# Patient Record
Sex: Female | Born: 1935
Health system: Southern US, Community
[De-identification: ages and names within clinical notes are randomized; demographics above are authoritative.]

---

## 2018-10-01 ENCOUNTER — Emergency Department (HOSPITAL_BASED_OUTPATIENT_CLINIC_OR_DEPARTMENT_OTHER)
Admission: EM | Admit: 2018-10-01 | Discharge: 2018-10-01 | Disposition: A | Discharge status: Home / Self Care | Payer: Medicare Other | Attending: Emergency Medicine | Admitting: Emergency Medicine

## 2018-10-01 ENCOUNTER — Emergency Department (HOSPITAL_BASED_OUTPATIENT_CLINIC_OR_DEPARTMENT_OTHER): Payer: Medicare Other

## 2018-10-01 ENCOUNTER — Other Ambulatory Visit: Payer: Self-pay

## 2018-10-01 ENCOUNTER — Encounter (HOSPITAL_BASED_OUTPATIENT_CLINIC_OR_DEPARTMENT_OTHER): Payer: Self-pay | Admitting: *Deleted

## 2018-10-01 DIAGNOSIS — S42001A Fracture of unspecified part of right clavicle, initial encounter for closed fracture: Secondary | ICD-10-CM | POA: Insufficient documentation

## 2018-10-01 DIAGNOSIS — W19XXXA Unspecified fall, initial encounter: Secondary | ICD-10-CM | POA: Diagnosis not present

## 2018-10-01 DIAGNOSIS — F0390 Unspecified dementia without behavioral disturbance: Secondary | ICD-10-CM | POA: Diagnosis not present

## 2018-10-01 DIAGNOSIS — Y929 Unspecified place or not applicable: Secondary | ICD-10-CM | POA: Diagnosis not present

## 2018-10-01 DIAGNOSIS — Y999 Unspecified external cause status: Secondary | ICD-10-CM | POA: Insufficient documentation

## 2018-10-01 DIAGNOSIS — Y939 Activity, unspecified: Secondary | ICD-10-CM | POA: Diagnosis not present

## 2018-10-01 DIAGNOSIS — R42 Dizziness and giddiness: Secondary | ICD-10-CM | POA: Diagnosis not present

## 2018-10-01 DIAGNOSIS — S4991XA Unspecified injury of right shoulder and upper arm, initial encounter: Secondary | ICD-10-CM | POA: Diagnosis present

## 2018-10-01 DIAGNOSIS — J449 Chronic obstructive pulmonary disease, unspecified: Secondary | ICD-10-CM | POA: Diagnosis not present

## 2018-10-01 LAB — CBC
HCT: 36.6 % (ref 36.0–46.0)
Hemoglobin: 11.5 g/dL — ABNORMAL LOW (ref 12.0–15.0)
MCH: 31.1 pg (ref 26.0–34.0)
MCHC: 31.4 g/dL (ref 30.0–36.0)
MCV: 98.9 fL (ref 80.0–100.0)
NRBC: 0 % (ref 0.0–0.2)
PLATELETS: 230 10*3/uL (ref 150–400)
RBC: 3.7 MIL/uL — AB (ref 3.87–5.11)
RDW: 13.9 % (ref 11.5–15.5)
WBC: 7.5 10*3/uL (ref 4.0–10.5)

## 2018-10-01 LAB — URINALYSIS, ROUTINE W REFLEX MICROSCOPIC
Bilirubin Urine: NEGATIVE
Glucose, UA: NEGATIVE mg/dL
Ketones, ur: NEGATIVE mg/dL
Nitrite: NEGATIVE
PROTEIN: NEGATIVE mg/dL
Specific Gravity, Urine: 1.015 (ref 1.005–1.030)
pH: 7.5 (ref 5.0–8.0)

## 2018-10-01 LAB — BASIC METABOLIC PANEL
ANION GAP: 7 (ref 5–15)
BUN: 22 mg/dL (ref 8–23)
CO2: 27 mmol/L (ref 22–32)
Calcium: 9.7 mg/dL (ref 8.9–10.3)
Chloride: 101 mmol/L (ref 98–111)
Creatinine, Ser: 1.11 mg/dL — ABNORMAL HIGH (ref 0.44–1.00)
GFR calc Af Amer: 51 mL/min — ABNORMAL LOW (ref >60)
GFR, EST NON AFRICAN AMERICAN: 44 mL/min — AB (ref >60)
Glucose, Bld: 109 mg/dL — ABNORMAL HIGH (ref 70–99)
POTASSIUM: 4.1 mmol/L (ref 3.5–5.1)
Sodium: 135 mmol/L (ref 135–145)

## 2018-10-01 LAB — URINALYSIS, MICROSCOPIC (REFLEX)

## 2018-10-01 MED ORDER — MECLIZINE HCL 25 MG PO TABS: 25.0000 mg | ORAL_TABLET | Freq: Three times a day (TID) | ORAL | 0 refills | Status: AC | PRN

## 2018-10-01 NOTE — ED Notes (Signed)
Patient is not steady on her feet.  She needs to be assisted.

## 2018-10-01 NOTE — Discharge Instructions (Addendum)
You were evaluated in the Emergency Department and after careful evaluation, we did not find any emergent condition requiring admission or further testing in the hospital.  Your dizziness seems to be due to vertigo.  Your fall resulted in a broken collarbone.  You can use the meclizine medication provided as needed for dizziness at home.  Please wear the sling for the next 2 to 4 weeks and follow-up with the orthopedic experts provided.  Please return to the Emergency Department if you experience any worsening of your condition.  We encourage you to follow up with a primary care provider.  Thank you for allowing [place] to be a part of your care.

## 2018-10-01 NOTE — ED Notes (Signed)
Patient transported to CT 

## 2018-10-01 NOTE — ED Provider Notes (Signed)
[facility] Emergency Department Provider Note MRN:  [id number]  Arrival date & time: 10/01/18     Chief Complaint   Fall   History of Present Illness   Jennifer Shaffer is a 85 y.o. year-old female with a history of CKD, COPD, dementia presenting to the ED with chief complaint of fall.  Patient has had 2 falls in the past 2 days.  Patient explains that she has been getting dizzy and that has contributed to the falls.  Describes the dizziness as a room spinning sensation.  Also explains that sometimes it feels that her legs just give out.  Head trauma last night and this morning with both falls.  Denies blood thinners other than baby aspirin at home.  Denies neck or back pain, no chest pain or shortness of breath, no abdominal pain, no hip pain, no numbness weakness to the arms or legs.  Endorsing moderate right shoulder pain that is worse with palpation or motion.  Denies nausea or vomiting, no recent fever or cold-like symptoms.  Normal diet.  Family explains that she has been diagnosed with BPPV in the past.  Dizziness spells do seem to be related to head position.  Review of Systems  A complete 10 system review of systems was obtained and all systems are negative except as noted in the HPI and PMH.   Patient's Health History   Past medical history: BPPV, COPD, dementia, CKD Social history: Does not smoke but was exposed to secondhand smoke for decades. No family history on file.  Social History   Socioeconomic History  . Marital status: Widowed    Spouse name: Not on file  . Number of children: Not on file  . Years of education: Not on file  . Highest education level: Not on file  Occupational History  . Not on file  Social Needs  . Financial resource strain: Not on file  . Food insecurity:    Worry: Not on file    Inability: Not on file  . Transportation needs:    Medical: Not on file    Non-medical: Not on file  Tobacco Use  . Smoking status:  Never Smoker  . Smokeless tobacco: Never Used  Substance and Sexual Activity  . Alcohol use: Never    Frequency: Never  . Drug use: Never  . Sexual activity: Not on file  Lifestyle  . Physical activity:    Days per week: Not on file    Minutes per session: Not on file  . Stress: Not on file  Relationships  . Social connections:    Talks on phone: Not on file    Gets together: Not on file    Attends religious service: Not on file    Active member of club or organization: Not on file    Attends meetings of clubs or organizations: Not on file    Relationship status: Not on file  . Intimate partner violence:    Fear of current or ex partner: Not on file    Emotionally abused: Not on file    Physically abused: Not on file    Forced sexual activity: Not on file  Other Topics Concern  . Not on file  Social History Narrative  . Not on file     Physical Exam  Vital Signs and Nursing Notes reviewed Vitals:   10/01/18 1502 10/01/18 1718  BP: (!) 174/63 (!) 164/69  Pulse: 82 74  Resp: 18 16  Temp: 98.8 F (  37.1 C)   SpO2: 99% 98%    CONSTITUTIONAL: Well-appearing, NAD NEURO:  Alert and oriented x 3, no focal deficits EYES:  eyes equal and reactive ENT/NECK:  no LAD, no JVD CARDIO: Regular rate, well-perfused, normal S1 and S2 PULM:  CTAB no wheezing or rhonchi GI/GU:  normal bowel sounds, non-distended, non-tender MSK/SPINE:  No gross deformities, no edema; tenderness palpation of the right shoulder with decreased range of motion due to pain SKIN: Hematoma to the right temporal scalp, with overlying small abrasion PSYCH:  Appropriate speech and behavior  Diagnostic and Interventional Summary    EKG Interpretation  Date/Time:  Saturday October 01 2018 15:56:29 EDT Ventricular Rate:  83 PR Interval:    QRS Duration: 92 QT Interval:  366 QTC Calculation: 430 R Axis:   -4 Text Interpretation:  Sinus rhythm RSR' in V1 or V2, right VCD or RVH Confirmed by Kennis Carina  [phone]) on 10/01/2018 4:46:14 PM      Labs Reviewed  CBC - Abnormal; Notable for the following components:      Result Value   RBC 3.70 (*)    Hemoglobin 11.5 (*)    All other components within normal limits  BASIC METABOLIC PANEL - Abnormal; Notable for the following components:   Glucose, Bld 109 (*)    Creatinine, Ser 1.11 (*)    GFR calc non Af Amer 44 (*)    GFR calc Af Amer 51 (*)    All other components within normal limits  URINALYSIS, ROUTINE W REFLEX MICROSCOPIC - Abnormal; Notable for the following components:   Hgb urine dipstick TRACE (*)    Leukocytes,Ua SMALL (*)    All other components within normal limits  URINALYSIS, MICROSCOPIC (REFLEX) - Abnormal; Notable for the following components:   Bacteria, UA MANY (*)    All other components within normal limits  URINE CULTURE    DG Chest 2 View  Final Result    DG Shoulder Right  Final Result    CT HEAD WO CONTRAST  Final Result    CT CERVICAL SPINE WO CONTRAST  Final Result      Medications - No data to display   Procedures Critical Care  ED Course and Medical Decision Making  I have reviewed the triage vital signs and the nursing notes.  Pertinent labs & imaging results that were available during my care of the patient were reviewed by me and considered in my medical decision making (see below for details).  Low concern for significant traumatic injury but given patient's age and signs of trauma on exam, will CT to exclude intracranial hemorrhage.  Dizziness seems more consistent with vertigo, favoring peripheral given patient's lack of neurological deficits, no nystagmus, history of BPPV, and the fact that dizziness is brought on by change in head position.  Also considering metabolic disarray.  EKG, labs, urinalysis pending.  EKG, chest x-ray, labs reassuring.  Urinalysis with weak evidence to suggest infection, will send for culture.  Empiric treatment for UTI not indicated given lack of fever, no  suprapubic tenderness, no dysuria.  Patient was able to ambulate, witnessed by daughter, did well but still having intermittent vertigo, again is consistently triggered by change in position, quickly resolves.  Much more consistent with peripheral vertigo, no indication for further CNS imaging at this time.  CT head is without acute emergent findings.  Given patient's clavicle fracture and continued intermittent vertigo, seems unsafe to live alone without assistance.  Agreement was made with daughter, who  will provide 24 7 assistance for the next few days until other arrangements can be made.  Referred to case management, who should call them to discuss acute rehab facilities or other arrangements.  After the discussed management above, the patient was determined to be safe for discharge.  The patient was in agreement with this plan and all questions regarding their care were answered.  ED return precautions were discussed and the patient will return to the ED with any significant worsening of condition.  Elmer SowMichael M. Pilar PlateBero, MD [facility] [email or web address]  Final Clinical Impressions(s) / ED Diagnoses     ICD-10-CM   1. Closed nondisplaced fracture of right clavicle, unspecified part of clavicle, initial encounter S42.001A   2. Fall W19.Lorne [id number] DG Chest 2 View    DG Chest 2 View    DG Shoulder Right    DG Shoulder Right  3. Vertigo R42     ED Discharge Orders         Ordered    meclizine (ANTIVERT) 25 MG tablet  3 times daily PRN     10/01/18 1719             Sabas SousBero, Jacquise Rarick M, MD 10/01/18 1825

## 2018-10-01 NOTE — ED Triage Notes (Signed)
Pt reports she has fallen twice since yesterday, both times while outside. She lives by herself but states her children check on her. C/o pain in right arm.

## 2018-10-02 NOTE — TOC Initial Note (Signed)
Transition of Care [facility]) - Initial/Assessment Note    Patient Details  Name: Margarethe Schoenberger MRN: [id number] Date of Birth: 06-07-1927  Transition of Care [facility]) CM/SW Contact:    Elliot Cousin, RN Phone Number: 10/02/2018, 1:24 PM  Clinical Narrative:                  Pt gave permission to speak to dtr, Jasmine December. Dtr states she is active with [facility] for PT. Faxed HH orders to [facility]. Called [facility] on call [facility], waiting call back. She believes pt has a Systems analyst. She probably have to hire a private duty aide. She has a RW. She has considered ALF but feels pt will still fall at ALF.     Expected Discharge Plan: Home w Home Health Services Barriers to Discharge: No Barriers Identified   Patient Goals and CMS Choice Patient states their goals for this hospitalization and ongoing recovery are:: decrease falls CMS Medicare.gov Compare Post Acute Care list provided to:: Patient Represenative (must comment)(Sharon Rochele Raring) Choice offered to / list presented to : Adult Children  Expected Discharge Plan and Services Expected Discharge Plan: Home w Home Health Services In-house Referral: NA Discharge Planning Services: CM Consult Post Acute Care Choice: Home Health Living arrangements for the past 2 months: Single Family Home                 DME Arranged: N/A DME Agency: NA HH Arranged: RN, OT, Social Work, PT, Nurse's Aide HH Agency: [facility]  Prior Living Arrangements/Services Living arrangements for the past 2 months: Single Family Home Lives with:: Self(daughter has been living in home with patient) Patient language and need for interpreter reviewed:: Yes Do you feel safe going back to the place where you live?: Yes      Need for Family Participation in Patient Care: Yes (Comment) Care giver support system in place?: Yes (comment)   Criminal Activity/Legal Involvement Pertinent to Current Situation/Hospitalization: No - Comment as needed  Activities  of Daily Living      Permission Sought/Granted Permission sought to share information with : Case Manager, PCP Permission granted to share information with : Yes, Verbal Permission Granted              Emotional Assessment       Orientation: : Oriented to Self, Oriented to Place, Oriented to  Time, Oriented to Situation   Psych Involvement: No (comment)  Admission diagnosis:  FALL There are no active problems to display for this patient.  PCP:  Worthy Rancher, MD Pharmacy:  No Pharmacies Listed    Social Determinants of Health (SDOH) Interventions    Readmission Risk Interventions No flowsheet data found.

## 2018-10-03 ENCOUNTER — Telehealth: Payer: Self-pay | Admitting: *Deleted

## 2018-10-03 LAB — URINE CULTURE: Culture: NO GROWTH

## 2018-10-03 NOTE — Telephone Encounter (Signed)
[facility] TOC CM   Contacted [facility] to follow up on referral for additional HH, pt recent dc from [facility]. Pt was active with [facility] for HHPT. Spoke to intake and HH orders received. States they has spoken to pt's dtr. Isidoro Donning RN CCM Case Mgmt phone [phone]

## 2019-02-18 ENCOUNTER — Encounter (HOSPITAL_BASED_OUTPATIENT_CLINIC_OR_DEPARTMENT_OTHER): Payer: Self-pay | Admitting: Emergency Medicine

## 2019-02-18 ENCOUNTER — Emergency Department (HOSPITAL_BASED_OUTPATIENT_CLINIC_OR_DEPARTMENT_OTHER)
Admission: EM | Admit: 2019-02-18 | Discharge: 2019-02-18 | Disposition: A | Discharge status: Home / Self Care | Payer: Medicare Other | Attending: Emergency Medicine | Admitting: Emergency Medicine

## 2019-02-18 ENCOUNTER — Other Ambulatory Visit: Payer: Self-pay

## 2019-02-18 ENCOUNTER — Emergency Department (HOSPITAL_BASED_OUTPATIENT_CLINIC_OR_DEPARTMENT_OTHER): Payer: Medicare Other

## 2019-02-18 DIAGNOSIS — E860 Dehydration: Secondary | ICD-10-CM | POA: Diagnosis not present

## 2019-02-18 DIAGNOSIS — R531 Weakness: Secondary | ICD-10-CM

## 2019-02-18 DIAGNOSIS — M6281 Muscle weakness (generalized): Secondary | ICD-10-CM | POA: Diagnosis not present

## 2019-02-18 DIAGNOSIS — Z9181 History of falling: Secondary | ICD-10-CM | POA: Insufficient documentation

## 2019-02-18 LAB — URINALYSIS, ROUTINE W REFLEX MICROSCOPIC
Bilirubin Urine: NEGATIVE
Glucose, UA: NEGATIVE mg/dL
Ketones, ur: NEGATIVE mg/dL
Leukocytes,Ua: NEGATIVE
Nitrite: NEGATIVE
Protein, ur: NEGATIVE mg/dL
Specific Gravity, Urine: 1.02 (ref 1.005–1.030)
pH: 6.5 (ref 5.0–8.0)

## 2019-02-18 LAB — COMPREHENSIVE METABOLIC PANEL
ALT: 25 U/L (ref 0–44)
AST: 33 U/L (ref 15–41)
Albumin: 4 g/dL (ref 3.5–5.0)
Alkaline Phosphatase: 74 U/L (ref 38–126)
Anion gap: 12 (ref 5–15)
BUN: 29 mg/dL — ABNORMAL HIGH (ref 8–23)
CO2: 21 mmol/L — ABNORMAL LOW (ref 22–32)
Calcium: 9.7 mg/dL (ref 8.9–10.3)
Chloride: 108 mmol/L (ref 98–111)
Creatinine, Ser: 1.17 mg/dL — ABNORMAL HIGH (ref 0.44–1.00)
GFR calc Af Amer: 48 mL/min — ABNORMAL LOW (ref >60)
GFR calc non Af Amer: 41 mL/min — ABNORMAL LOW (ref >60)
Glucose, Bld: 125 mg/dL — ABNORMAL HIGH (ref 70–99)
Potassium: 3.9 mmol/L (ref 3.5–5.1)
Sodium: 141 mmol/L (ref 135–145)
Total Bilirubin: 0.6 mg/dL (ref 0.3–1.2)
Total Protein: 6.7 g/dL (ref 6.5–8.1)

## 2019-02-18 LAB — URINALYSIS, MICROSCOPIC (REFLEX)

## 2019-02-18 LAB — CBC WITH DIFFERENTIAL/PLATELET
Abs Immature Granulocytes: 0.04 10*3/uL (ref 0.00–0.07)
Basophils Absolute: 0.1 10*3/uL (ref 0.0–0.1)
Basophils Relative: 1 %
Eosinophils Absolute: 0.2 10*3/uL (ref 0.0–0.5)
Eosinophils Relative: 2 %
HCT: 36.6 % (ref 36.0–46.0)
Hemoglobin: 11.9 g/dL — ABNORMAL LOW (ref 12.0–15.0)
Immature Granulocytes: 1 %
Lymphocytes Relative: 27 %
Lymphs Abs: 2.4 10*3/uL (ref 0.7–4.0)
MCH: 32.2 pg (ref 26.0–34.0)
MCHC: 32.5 g/dL (ref 30.0–36.0)
MCV: 98.9 fL (ref 80.0–100.0)
Monocytes Absolute: 0.8 10*3/uL (ref 0.1–1.0)
Monocytes Relative: 10 %
Neutro Abs: 5.2 10*3/uL (ref 1.7–7.7)
Neutrophils Relative %: 59 %
Platelets: 223 10*3/uL (ref 150–400)
RBC: 3.7 MIL/uL — ABNORMAL LOW (ref 3.87–5.11)
RDW: 13.6 % (ref 11.5–15.5)
WBC: 8.7 10*3/uL (ref 4.0–10.5)
nRBC: 0 % (ref 0.0–0.2)

## 2019-02-18 LAB — LIPASE, BLOOD: Lipase: 39 U/L (ref 11–51)

## 2019-02-18 MED ORDER — SODIUM CHLORIDE 0.9 % IV BOLUS
1000.0000 mL | Freq: Once | INTRAVENOUS | Status: AC
  Administered 2019-02-18: 19:00:00 1000 mL via INTRAVENOUS

## 2019-02-18 MED ORDER — SODIUM CHLORIDE 0.9 % IV SOLN: 1.0000 g | Freq: Once | INTRAVENOUS | Status: DC

## 2019-02-18 NOTE — Discharge Instructions (Signed)
Please stay hydrated and follow up with your doctor for further management of your health.  Return if you have any concerns.

## 2019-02-18 NOTE — ED Provider Notes (Signed)
[facility] EMERGENCY DEPARTMENT Provider Note   CSN: [id number] Arrival date & time: 02/18/19  1739     History   Chief Complaint Chief Complaint  Patient presents with  . Weakness  . Fall    HPI Jennifer Shaffer is a 85 y.o. female.     The history is provided by the patient and a relative. No language interpreter was used.  Weakness Fall     85 year old female accompanied by family member to the ED for evaluation recurrent falls and generalized weakness.  Patient lives at home by herself.  Her daughter who lives in [place] recently came down to visit her.  She noticed that for the past few weeks patient has had progressive weakness.  She fell twice last fall was 2 days ago.  Patient states she was walking from the bathroom to her bedroom when her leg gave out and she fell backward striking her head but denies any loss of consciousness.  She has had diarrhea for 3 to 4 days earlier this week which was felt to be related to medication for appetite stimulant.  Diarrhea has since resolved, after the medication was discontinued.  No associated fever or chills no chest pain shortness of breath or productive cough, no complaints of dysuria or decreased appetite.  She is not on any blood thinner medication.  Daughter mention yesterday patient was choking on her food.  Daughter has to perform Heimlich maneuver and now patient endorsed some mild discomfort about her breast.  Given her progressive weakness, patient's daughter would like for her to be "checked out".  Patient without any recent sick contact.  No past medical history on file.  There are no active problems to display for this patient.   The histories are not reviewed yet. Please review them in the "History" navigator section and refresh this SmartLink.   OB History   No obstetric history on file.      Home Medications    Prior to Admission medications   Medication Sig Start Date End Date Taking? Authorizing  Provider  meclizine (ANTIVERT) 25 MG tablet Take 1 tablet (25 mg total) by mouth 3 (three) times daily as needed for dizziness. 10/01/18   Sabas SousBero, Michael M, MD    Family History No family history on file.  Social History Social History   Tobacco Use  . Smoking status: Never Smoker  . Smokeless tobacco: Never Used  Substance Use Topics  . Alcohol use: Never    Frequency: Never  . Drug use: Never     Allergies   Patient has no known allergies.   Review of Systems Review of Systems  Neurological: Positive for weakness.  All other systems reviewed and are negative.    Physical Exam Updated Vital Signs BP (!) 179/63 (BP Location: Right Arm)   Pulse 84   Temp 98.4 F (36.9 C) (Oral)   Resp 20   Ht 5\' 2"  (1.575 m)   Wt 38.6 kg   SpO2 100%   BMI 15.55 kg/m   Physical Exam Vitals signs and nursing note reviewed.  Constitutional:      General: She is not in acute distress.    Appearance: She is well-developed.     Comments: Elderly frail-appearing female in no acute discomfort.  HENT:     Head: Normocephalic and atraumatic.     Comments: No scalp tenderness Eyes:     Conjunctiva/sclera: Conjunctivae normal.  Neck:     Musculoskeletal: Normal range of motion and  neck supple. No muscular tenderness.  Cardiovascular:     Rate and Rhythm: Normal rate and regular rhythm.     Pulses: Normal pulses.     Heart sounds: Normal heart sounds.  Pulmonary:     Effort: Pulmonary effort is normal.     Breath sounds: Normal breath sounds. No wheezing, rhonchi or rales.  Abdominal:     Palpations: Abdomen is soft. There is no mass.     Tenderness: There is no abdominal tenderness.  Musculoskeletal:        General: No tenderness.     Comments: Mild generalized weakness but equal strength throughout all 4 extremities  Skin:    Findings: No rash.  Neurological:     Mental Status: She is alert and oriented to person, place, and time.  Psychiatric:        Mood and Affect: Mood  normal.      ED Treatments / Results  Labs (all labs ordered are listed, but only abnormal results are displayed) Labs Reviewed  CBC WITH DIFFERENTIAL/PLATELET - Abnormal; Notable for the following components:      Result Value   RBC 3.70 (*)    Hemoglobin 11.9 (*)    All other components within normal limits  COMPREHENSIVE METABOLIC PANEL - Abnormal; Notable for the following components:   CO2 21 (*)    Glucose, Bld 125 (*)    BUN 29 (*)    Creatinine, Ser 1.17 (*)    GFR calc non Af Amer 41 (*)    GFR calc Af Amer 48 (*)    All other components within normal limits  URINALYSIS, ROUTINE W REFLEX MICROSCOPIC - Abnormal; Notable for the following components:   Hgb urine dipstick TRACE (*)    All other components within normal limits  URINALYSIS, MICROSCOPIC (REFLEX) - Abnormal; Notable for the following components:   Bacteria, UA MANY (*)    All other components within normal limits  URINE CULTURE  LIPASE, BLOOD    EKG EKG Interpretation  Date/Time:  Saturday February 18 2019 17:50:13 EDT Ventricular Rate:  87 PR Interval:    QRS Duration: 96 QT Interval:  367 QTC Calculation: 442 R Axis:   24 Text Interpretation:  Sinus rhythm Abnormal R-wave progression, early transition Baseline wander in lead(s) V3 When compared to prior, no significant changes seen.  No STEMI Confirmed by Antony Blackbird [phone]) on 02/18/2019 6:38:24 PM   Radiology Dg Chest Portable 1 View  Result Date: 02/18/2019 CLINICAL DATA:  Weakness. EXAM: PORTABLE CHEST 1 VIEW COMPARISON:  12/08/2018. FINDINGS: Normal sized heart. Clear lungs. The lungs remain hyperexpanded. Bilateral costal cartilage calcifications. Diffuse osteopenia. IMPRESSION: No acute abnormality. Stable changes of COPD. Electronically Signed   By: Claudie Revering M.D.   On: 02/18/2019 18:57    Procedures Procedures (including critical care time)  Medications Ordered in ED Medications  sodium chloride 0.9 % bolus 1,000 mL (1,000 mLs  Intravenous New Bag/Given 02/18/19 1845)     Initial Impression / Assessment and Plan / ED Course  I have reviewed the triage vital signs and the nursing notes.  Pertinent labs & imaging results that were available during my care of the patient were reviewed by me and considered in my medical decision making (see chart for details).        BP (!) 179/63 (BP Location: Right Arm)   Pulse 84   Temp 98.4 F (36.9 C) (Oral)   Resp 20   Ht 5\' 2"  (1.575 m)  Wt 38.6 kg   SpO2 100%   BMI 15.55 kg/m    Final Clinical Impressions(s) / ED Diagnoses   Final diagnoses:  Generalized weakness  Dehydration    ED Discharge Orders    None     6:16 PM Patient brought here for generalized weakness which has been a progressive problem for the past several weeks she has several recurrent falls likely secondary to generalized weakness.  I suspect the symptom may be related to recent bouts of diarrhea for several days which is since improved.  Will check basic labs, will screen for any kind of underlying infection such as pneumonia UTI.  Patient found to be hypertensive in the ED with a blood pressure of 179 systolic.   Aside from mild dehydration in which IVF was given, Work-up today unremarkable.  Urinalysis shows many bacteria however nitrite negative and leukocyte negative.  Urine culture sent.  Patient does not have any urinary complaint.  She is not anemic, white blood cells normal, chest x-ray unremarkable.  EKG unremarkable.  Patient able to ambulate using her walker. No significant sign of injury on exam.  Pt is stable for discharge, outpt f/u with PCP.  Care discussed with DR. Tegeler.    Fayrene Helperran, Shabana Armentrout, PA-C 02/18/19 2006    Tegeler, Canary Brimhristopher J, MD 02/19/19 Moses [id number]

## 2019-02-18 NOTE — ED Triage Notes (Signed)
Patient has had some weakness and further complications with falling x 2 weeks, last night she choked on her food last night  - she reports that she is shaking all the time  - she has had some weakness for the last year - daughter states that she is getting ready to take her to [facility] for a visit " and I just wanted to get her checked out with all these new developments"

## 2019-02-20 LAB — URINE CULTURE: Culture: <10000 — AB

## 2020-05-15 IMAGING — CT CT CERVICAL SPINE WITHOUT CONTRAST
4 of 7 series · 12 of 33 positions shown, 14 images · non-contrast
Comparison: None.

CLINICAL DATA: Multiple fall since yesterday. Right shoulder
abrasion and pain.

EXAM:
CT HEAD WITHOUT CONTRAST
CT CERVICAL SPINE WITHOUT CONTRAST
TECHNIQUE: Multidetector CT imaging of the head and cervical spine was
performed following the standard protocol without intravenous
contrast. Multiplanar CT image reconstructions of the cervical spine
were also generated.

[Series 7: c_spine 2.0 i30s 3 · axial · 0.35mm/px · z∈[+44,+88]mm · 2 of 88 slices shown]
[im 22/88  bone]
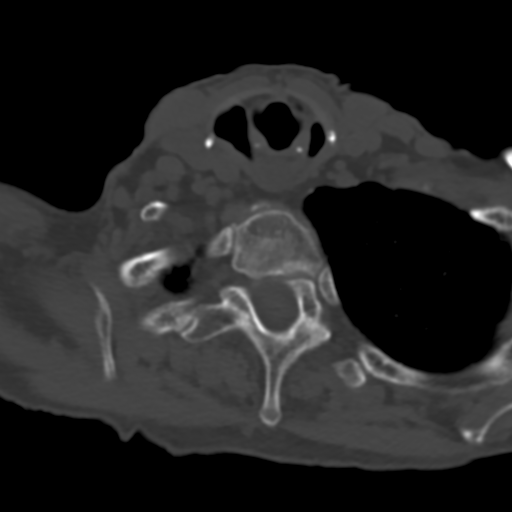
[im 44/88  bone]
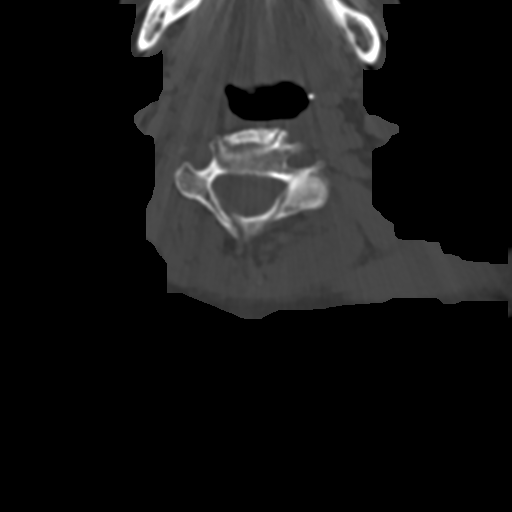

[Series 9: coronals · coronal · 0.24mm/px · 1 of 61 slices shown]
[im 31/61  bone]
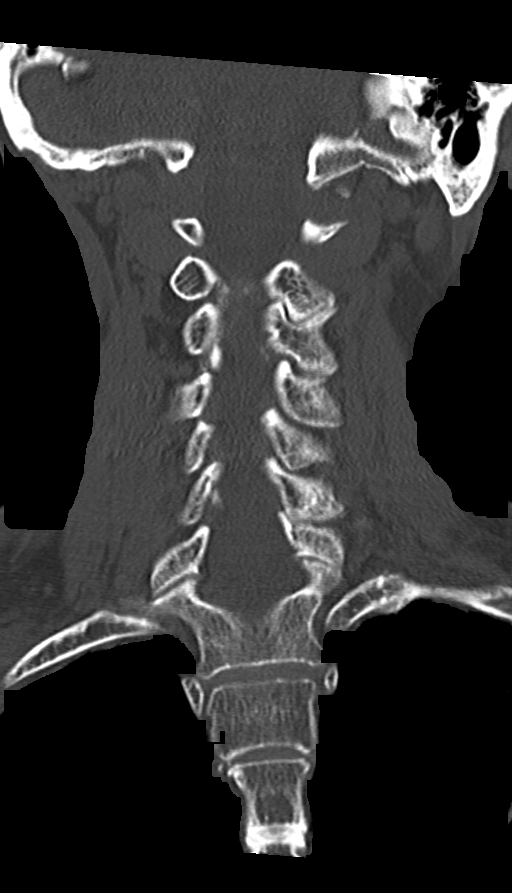

[Series 10: sagittals · sagittal · 0.23mm/px · 4 of 63 slices shown]
[im 13/63  bone]
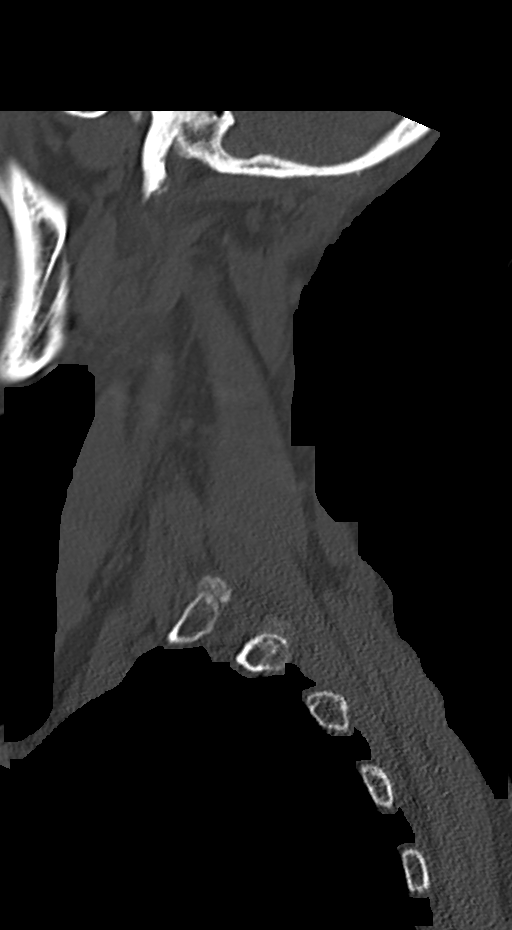
[im 25/63  bone]
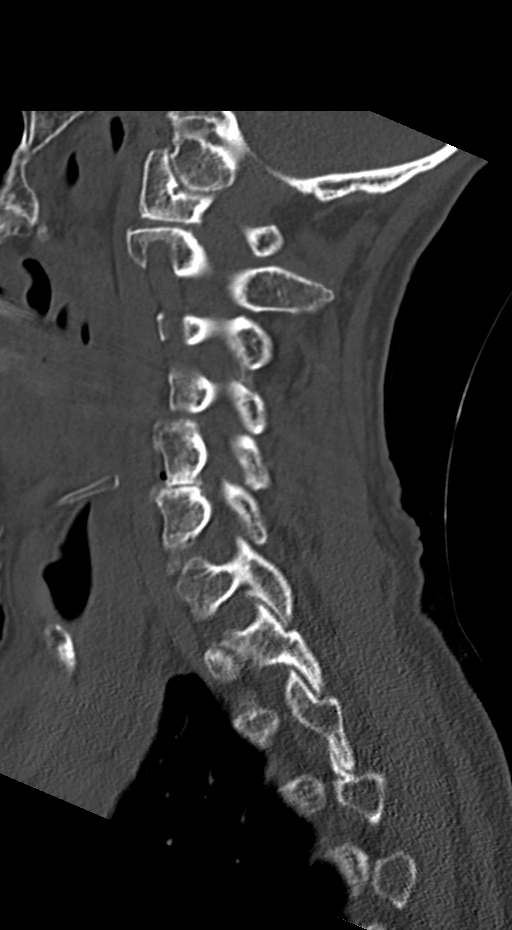
[im 38/63  bone]
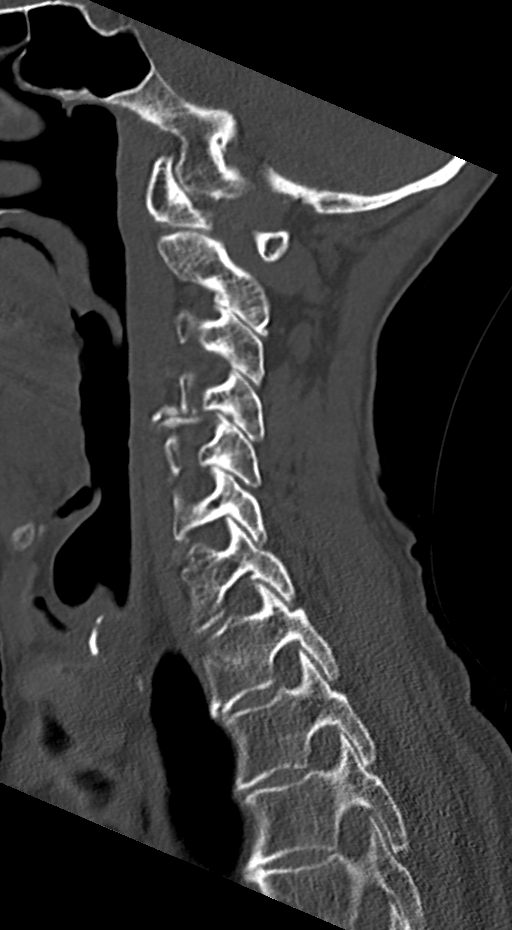
[im 50/63  bone]
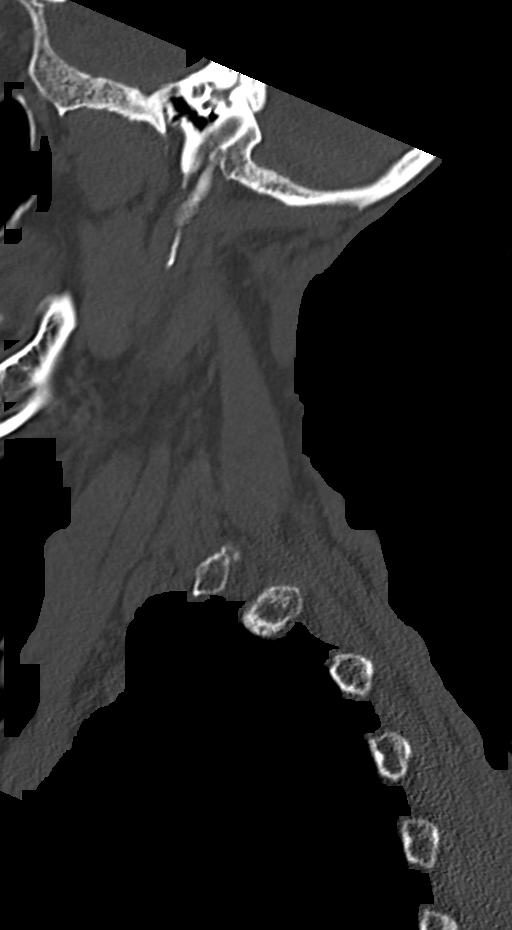

[Series 11: orthogonals · axial · 0.23mm/px · z∈[-4,+128]mm · 5 of 110 slices shown, 7 images]
[im 19/110  soft-tissue]
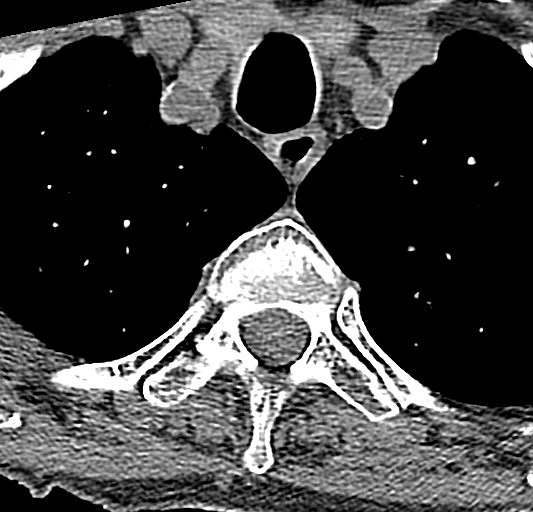
[im 19/110  bone]
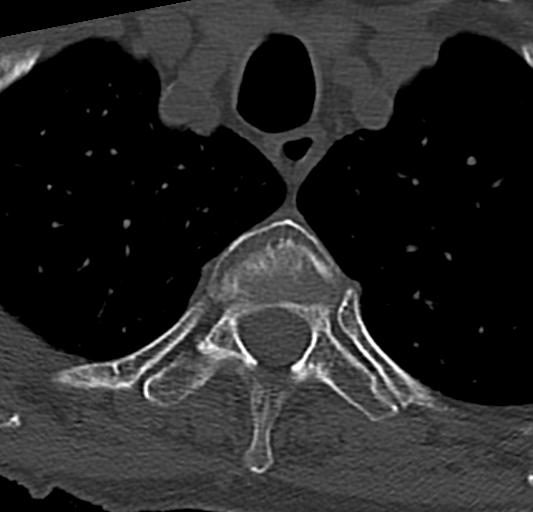
[im 37/110  bone]
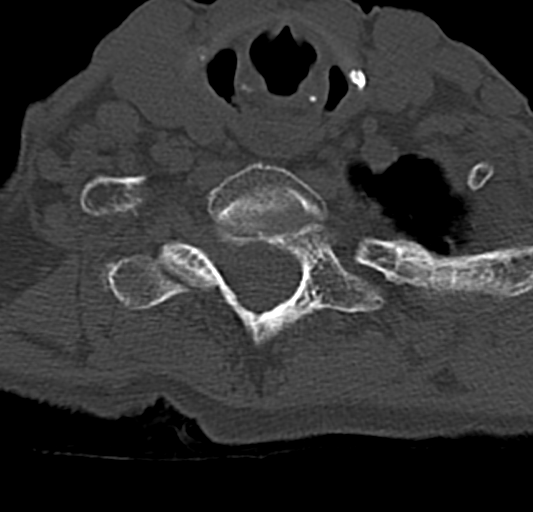
[im 55/110  bone]
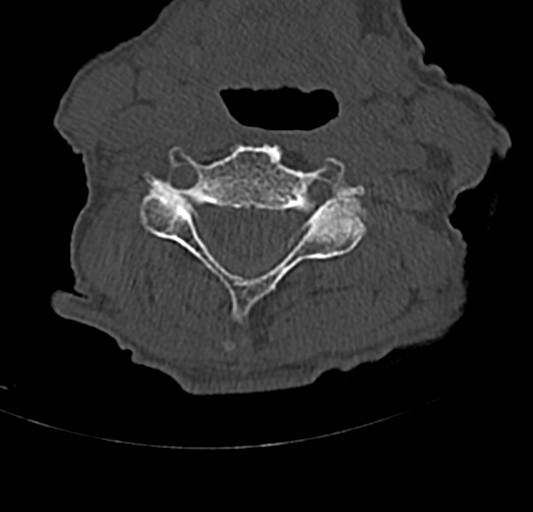
[im 73/110  bone]
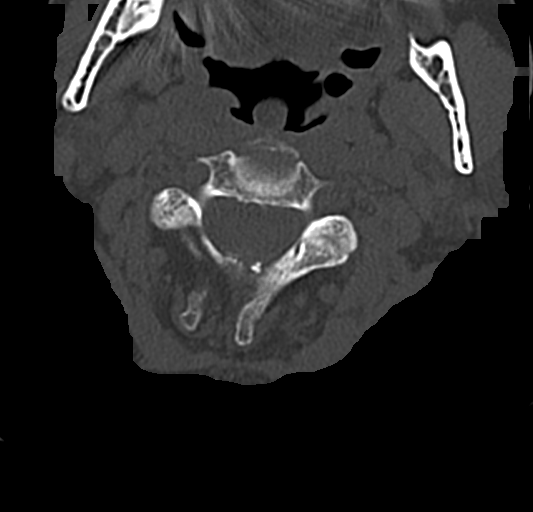
[im 91/110  soft-tissue]
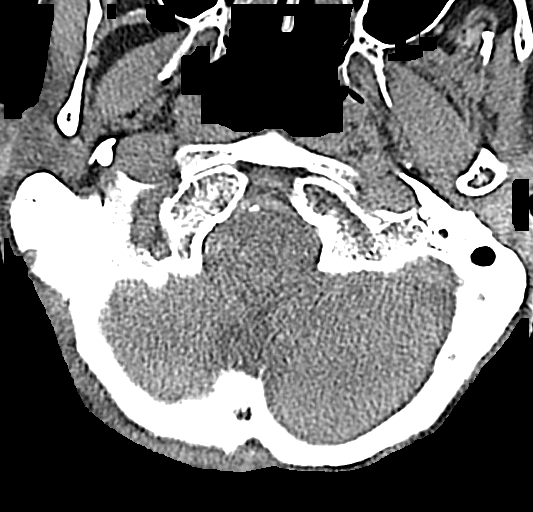
[im 91/110  bone]
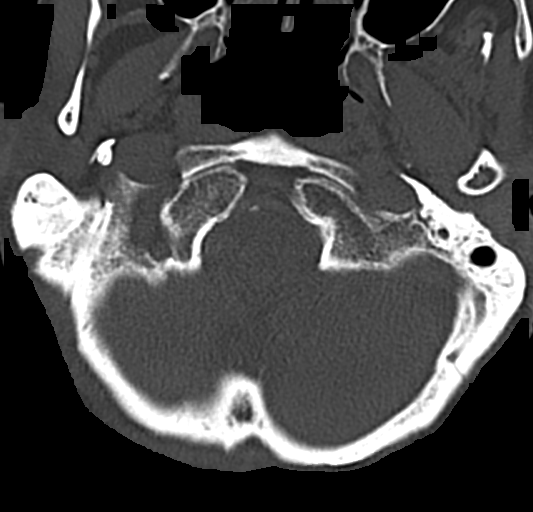

[12 of 33 positions shown; findings below may reference images not displayed]

FINDINGS: CT HEAD FINDINGS

Brain: There is no evidence of acute infarct, intracranial
hemorrhage, mass, midline shift, or extra-axial fluid collection.
Mild ventriculomegaly is attributed to central predominant cerebral
atrophy. Patchy and confluent cerebral white matter hypodensities
are nonspecific but compatible with severe chronic small vessel
ischemic disease. There is a chronic lacunar infarct at the level of
the right caudate body.

Vascular: Calcified atherosclerosis at the skull base. No hyperdense
vessel.

Skull: No fracture or focal osseous lesion.

Sinuses/Orbits: The visualized paranasal sinuses and mastoid air
cells are clear. Bilateral cataract extraction is noted.

Other: Small bilateral scalp hematomas.

CT CERVICAL SPINE FINDINGS

Alignment: Reversal of the normal cervical lordosis. Minimal
anterolisthesis of C3 on C4 and minimal retrolisthesis of C4 on C5
and C5 on C6.

Skull base and vertebrae: No acute fracture or suspicious osseous
lesion. Bilateral cervical ribs.

Soft tissues and spinal canal: No prevertebral fluid or swelling. No
visible canal hematoma.

Disc levels: Severe disc space narrowing at C4-5 and C5-6 with
milder narrowing at C3-4. Severe left neural foraminal stenosis at
C4-5 due to uncovertebral spurring. At least mild spinal stenosis at
C3-4 due to a partially calcified central disc protrusion.

Upper chest: Emphysema and mild biapical lung scarring.

Other: None.
IMPRESSION: 1. No evidence of acute intracranial abnormality.
2. Severe chronic small vessel ischemic disease.
3. Small bilateral scalp hematomas.
4. No acute cervical spine fracture. Advanced disc degeneration.

## 2020-07-07 DIAGNOSIS — U071 COVID-19: Secondary | ICD-10-CM | POA: Diagnosis not present

## 2020-07-08 DIAGNOSIS — U071 COVID-19: Secondary | ICD-10-CM | POA: Diagnosis not present

## 2020-08-06 DIAGNOSIS — F3341 Major depressive disorder, recurrent, in partial remission: Secondary | ICD-10-CM | POA: Diagnosis not present

## 2020-08-06 DIAGNOSIS — N1832 Chronic kidney disease, stage 3b: Secondary | ICD-10-CM | POA: Diagnosis not present

## 2020-08-06 DIAGNOSIS — F411 Generalized anxiety disorder: Secondary | ICD-10-CM | POA: Diagnosis not present

## 2020-08-27 DIAGNOSIS — I951 Orthostatic hypotension: Secondary | ICD-10-CM | POA: Diagnosis not present

## 2020-08-27 DIAGNOSIS — N183 Chronic kidney disease, stage 3 unspecified: Secondary | ICD-10-CM | POA: Diagnosis not present

## 2020-08-27 DIAGNOSIS — M1991 Primary osteoarthritis, unspecified site: Secondary | ICD-10-CM | POA: Diagnosis not present

## 2020-08-27 DIAGNOSIS — W19XXXD Unspecified fall, subsequent encounter: Secondary | ICD-10-CM | POA: Diagnosis not present

## 2020-08-27 DIAGNOSIS — H9193 Unspecified hearing loss, bilateral: Secondary | ICD-10-CM | POA: Diagnosis not present

## 2020-08-27 DIAGNOSIS — H3530 Unspecified macular degeneration: Secondary | ICD-10-CM | POA: Diagnosis not present

## 2020-08-27 DIAGNOSIS — M810 Age-related osteoporosis without current pathological fracture: Secondary | ICD-10-CM | POA: Diagnosis not present

## 2020-08-27 DIAGNOSIS — F419 Anxiety disorder, unspecified: Secondary | ICD-10-CM | POA: Diagnosis not present

## 2020-08-27 DIAGNOSIS — F0390 Unspecified dementia without behavioral disturbance: Secondary | ICD-10-CM | POA: Diagnosis not present

## 2020-08-27 DIAGNOSIS — F32A Depression, unspecified: Secondary | ICD-10-CM | POA: Diagnosis not present

## 2020-08-31 DIAGNOSIS — M1991 Primary osteoarthritis, unspecified site: Secondary | ICD-10-CM | POA: Diagnosis not present

## 2020-08-31 DIAGNOSIS — H3530 Unspecified macular degeneration: Secondary | ICD-10-CM | POA: Diagnosis not present

## 2020-08-31 DIAGNOSIS — F32A Depression, unspecified: Secondary | ICD-10-CM | POA: Diagnosis not present

## 2020-08-31 DIAGNOSIS — F419 Anxiety disorder, unspecified: Secondary | ICD-10-CM | POA: Diagnosis not present

## 2020-08-31 DIAGNOSIS — M810 Age-related osteoporosis without current pathological fracture: Secondary | ICD-10-CM | POA: Diagnosis not present

## 2020-08-31 DIAGNOSIS — W19XXXD Unspecified fall, subsequent encounter: Secondary | ICD-10-CM | POA: Diagnosis not present

## 2020-08-31 DIAGNOSIS — N183 Chronic kidney disease, stage 3 unspecified: Secondary | ICD-10-CM | POA: Diagnosis not present

## 2020-08-31 DIAGNOSIS — I951 Orthostatic hypotension: Secondary | ICD-10-CM | POA: Diagnosis not present

## 2020-08-31 DIAGNOSIS — H9193 Unspecified hearing loss, bilateral: Secondary | ICD-10-CM | POA: Diagnosis not present

## 2020-08-31 DIAGNOSIS — F0390 Unspecified dementia without behavioral disturbance: Secondary | ICD-10-CM | POA: Diagnosis not present

## 2020-09-04 DIAGNOSIS — H04123 Dry eye syndrome of bilateral lacrimal glands: Secondary | ICD-10-CM | POA: Diagnosis not present

## 2020-09-04 DIAGNOSIS — H35363 Drusen (degenerative) of macula, bilateral: Secondary | ICD-10-CM | POA: Diagnosis not present

## 2020-09-04 DIAGNOSIS — H18513 Endothelial corneal dystrophy, bilateral: Secondary | ICD-10-CM | POA: Diagnosis not present

## 2020-09-04 DIAGNOSIS — H353132 Nonexudative age-related macular degeneration, bilateral, intermediate dry stage: Secondary | ICD-10-CM | POA: Diagnosis not present

## 2020-09-07 DIAGNOSIS — W19XXXD Unspecified fall, subsequent encounter: Secondary | ICD-10-CM | POA: Diagnosis not present

## 2020-09-07 DIAGNOSIS — M810 Age-related osteoporosis without current pathological fracture: Secondary | ICD-10-CM | POA: Diagnosis not present

## 2020-09-07 DIAGNOSIS — F419 Anxiety disorder, unspecified: Secondary | ICD-10-CM | POA: Diagnosis not present

## 2020-09-07 DIAGNOSIS — M1991 Primary osteoarthritis, unspecified site: Secondary | ICD-10-CM | POA: Diagnosis not present

## 2020-09-07 DIAGNOSIS — H3530 Unspecified macular degeneration: Secondary | ICD-10-CM | POA: Diagnosis not present

## 2020-09-07 DIAGNOSIS — F32A Depression, unspecified: Secondary | ICD-10-CM | POA: Diagnosis not present

## 2020-09-07 DIAGNOSIS — N183 Chronic kidney disease, stage 3 unspecified: Secondary | ICD-10-CM | POA: Diagnosis not present

## 2020-09-07 DIAGNOSIS — H9193 Unspecified hearing loss, bilateral: Secondary | ICD-10-CM | POA: Diagnosis not present

## 2020-09-07 DIAGNOSIS — F0390 Unspecified dementia without behavioral disturbance: Secondary | ICD-10-CM | POA: Diagnosis not present

## 2020-09-07 DIAGNOSIS — I951 Orthostatic hypotension: Secondary | ICD-10-CM | POA: Diagnosis not present

## 2020-09-12 DIAGNOSIS — I10 Essential (primary) hypertension: Secondary | ICD-10-CM | POA: Diagnosis not present

## 2020-09-14 DIAGNOSIS — H3530 Unspecified macular degeneration: Secondary | ICD-10-CM | POA: Diagnosis not present

## 2020-09-14 DIAGNOSIS — W19XXXD Unspecified fall, subsequent encounter: Secondary | ICD-10-CM | POA: Diagnosis not present

## 2020-09-14 DIAGNOSIS — M810 Age-related osteoporosis without current pathological fracture: Secondary | ICD-10-CM | POA: Diagnosis not present

## 2020-09-14 DIAGNOSIS — I951 Orthostatic hypotension: Secondary | ICD-10-CM | POA: Diagnosis not present

## 2020-09-14 DIAGNOSIS — H9193 Unspecified hearing loss, bilateral: Secondary | ICD-10-CM | POA: Diagnosis not present

## 2020-09-14 DIAGNOSIS — F32A Depression, unspecified: Secondary | ICD-10-CM | POA: Diagnosis not present

## 2020-09-14 DIAGNOSIS — F0390 Unspecified dementia without behavioral disturbance: Secondary | ICD-10-CM | POA: Diagnosis not present

## 2020-09-14 DIAGNOSIS — M1991 Primary osteoarthritis, unspecified site: Secondary | ICD-10-CM | POA: Diagnosis not present

## 2020-09-14 DIAGNOSIS — F419 Anxiety disorder, unspecified: Secondary | ICD-10-CM | POA: Diagnosis not present

## 2020-09-14 DIAGNOSIS — N183 Chronic kidney disease, stage 3 unspecified: Secondary | ICD-10-CM | POA: Diagnosis not present

## 2020-09-21 DIAGNOSIS — N183 Chronic kidney disease, stage 3 unspecified: Secondary | ICD-10-CM | POA: Diagnosis not present

## 2020-09-21 DIAGNOSIS — F419 Anxiety disorder, unspecified: Secondary | ICD-10-CM | POA: Diagnosis not present

## 2020-09-21 DIAGNOSIS — I951 Orthostatic hypotension: Secondary | ICD-10-CM | POA: Diagnosis not present

## 2020-09-21 DIAGNOSIS — H9193 Unspecified hearing loss, bilateral: Secondary | ICD-10-CM | POA: Diagnosis not present

## 2020-09-21 DIAGNOSIS — M810 Age-related osteoporosis without current pathological fracture: Secondary | ICD-10-CM | POA: Diagnosis not present

## 2020-09-21 DIAGNOSIS — M1991 Primary osteoarthritis, unspecified site: Secondary | ICD-10-CM | POA: Diagnosis not present

## 2020-09-21 DIAGNOSIS — F0390 Unspecified dementia without behavioral disturbance: Secondary | ICD-10-CM | POA: Diagnosis not present

## 2020-09-21 DIAGNOSIS — F32A Depression, unspecified: Secondary | ICD-10-CM | POA: Diagnosis not present

## 2020-09-21 DIAGNOSIS — H3530 Unspecified macular degeneration: Secondary | ICD-10-CM | POA: Diagnosis not present

## 2020-09-21 DIAGNOSIS — W19XXXD Unspecified fall, subsequent encounter: Secondary | ICD-10-CM | POA: Diagnosis not present

## 2020-09-28 DIAGNOSIS — M1991 Primary osteoarthritis, unspecified site: Secondary | ICD-10-CM | POA: Diagnosis not present

## 2020-09-28 DIAGNOSIS — H3530 Unspecified macular degeneration: Secondary | ICD-10-CM | POA: Diagnosis not present

## 2020-09-28 DIAGNOSIS — M810 Age-related osteoporosis without current pathological fracture: Secondary | ICD-10-CM | POA: Diagnosis not present

## 2020-09-28 DIAGNOSIS — F0390 Unspecified dementia without behavioral disturbance: Secondary | ICD-10-CM | POA: Diagnosis not present

## 2020-09-28 DIAGNOSIS — F419 Anxiety disorder, unspecified: Secondary | ICD-10-CM | POA: Diagnosis not present

## 2020-09-28 DIAGNOSIS — F32A Depression, unspecified: Secondary | ICD-10-CM | POA: Diagnosis not present

## 2020-09-28 DIAGNOSIS — H9193 Unspecified hearing loss, bilateral: Secondary | ICD-10-CM | POA: Diagnosis not present

## 2020-09-28 DIAGNOSIS — W19XXXD Unspecified fall, subsequent encounter: Secondary | ICD-10-CM | POA: Diagnosis not present

## 2020-09-28 DIAGNOSIS — N183 Chronic kidney disease, stage 3 unspecified: Secondary | ICD-10-CM | POA: Diagnosis not present

## 2020-09-28 DIAGNOSIS — I951 Orthostatic hypotension: Secondary | ICD-10-CM | POA: Diagnosis not present

## 2020-10-01 DIAGNOSIS — I517 Cardiomegaly: Secondary | ICD-10-CM | POA: Diagnosis not present

## 2020-10-01 DIAGNOSIS — N1832 Chronic kidney disease, stage 3b: Secondary | ICD-10-CM | POA: Diagnosis not present

## 2020-10-01 DIAGNOSIS — F0390 Unspecified dementia without behavioral disturbance: Secondary | ICD-10-CM | POA: Diagnosis not present

## 2020-10-01 DIAGNOSIS — E441 Mild protein-calorie malnutrition: Secondary | ICD-10-CM | POA: Diagnosis not present

## 2020-10-01 DIAGNOSIS — R410 Disorientation, unspecified: Secondary | ICD-10-CM | POA: Diagnosis not present

## 2020-10-01 DIAGNOSIS — W19XXXA Unspecified fall, initial encounter: Secondary | ICD-10-CM | POA: Diagnosis not present

## 2020-10-01 DIAGNOSIS — I491 Atrial premature depolarization: Secondary | ICD-10-CM | POA: Diagnosis not present

## 2020-10-01 DIAGNOSIS — R55 Syncope and collapse: Secondary | ICD-10-CM | POA: Diagnosis not present

## 2020-10-02 IMAGING — DX PORTABLE CHEST - 1 VIEW
1 series · 1 of 1 positions shown · non-contrast
Comparison: 12/08/2018.

CLINICAL DATA: Weakness.

EXAM:
PORTABLE CHEST 1 VIEW

[chest ap]
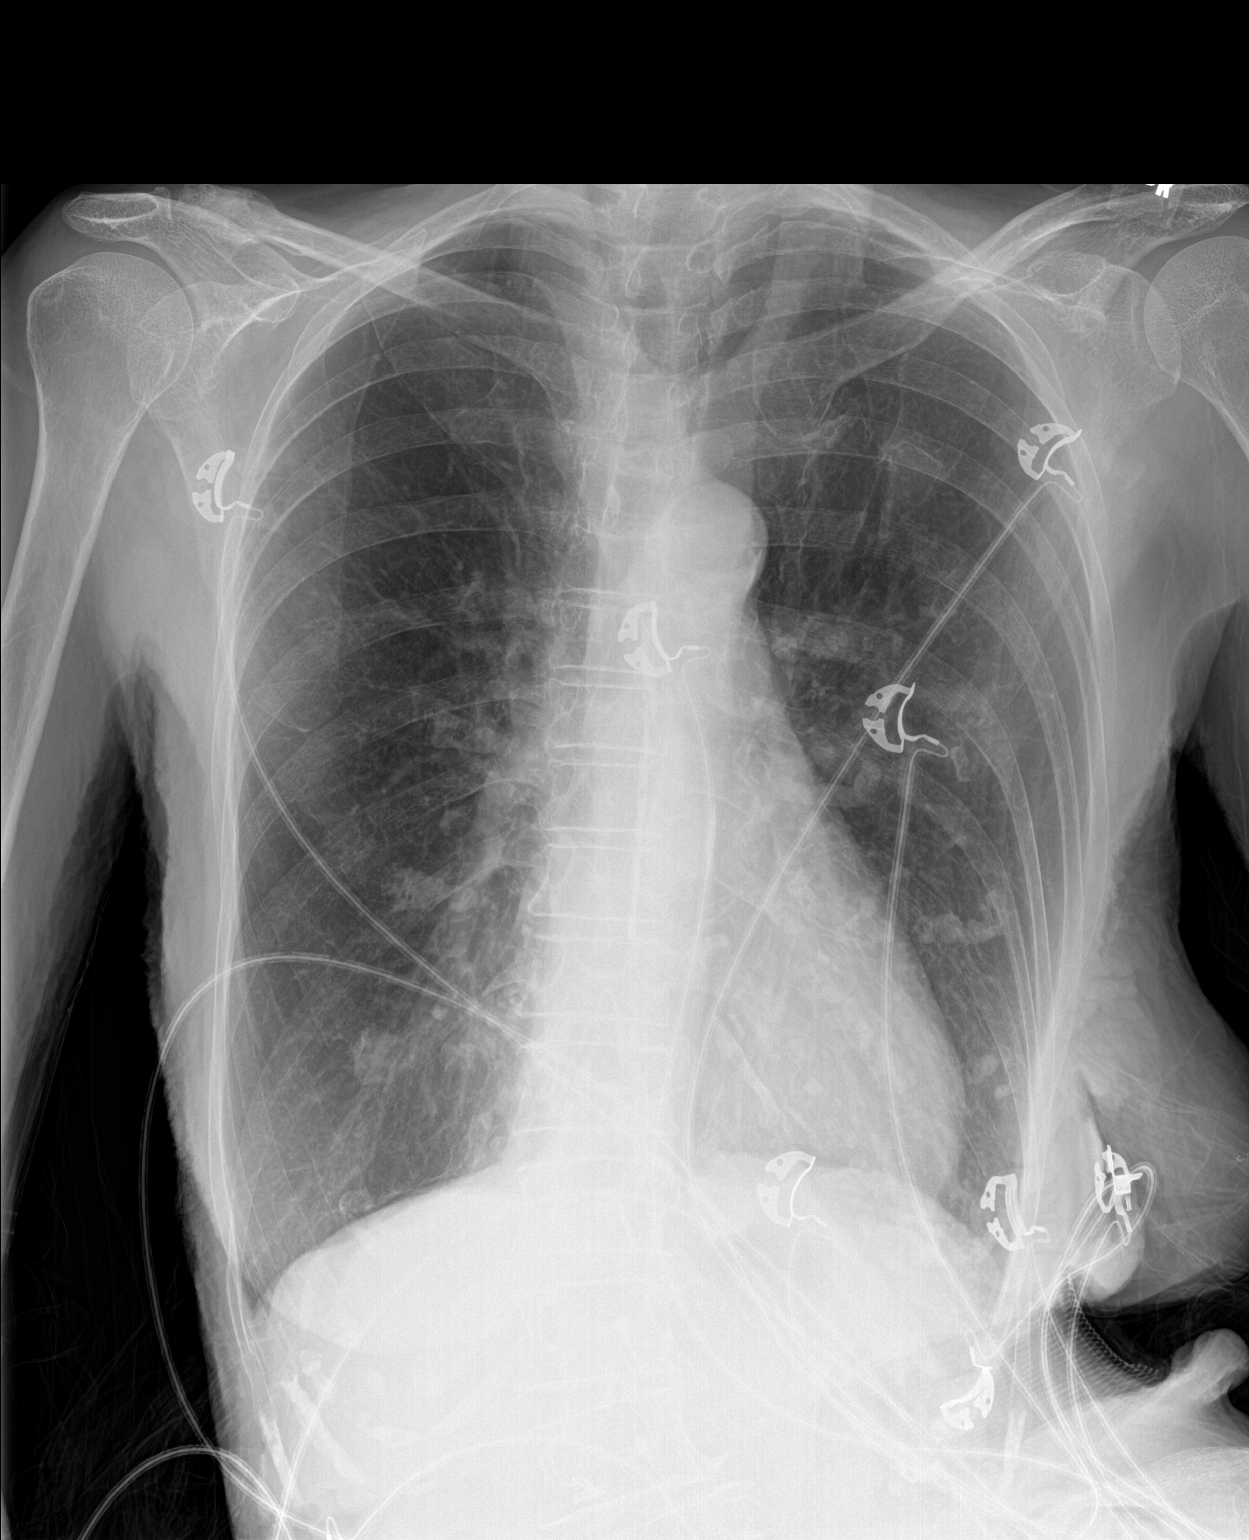

[1 of 1 positions shown; findings below may reference images not displayed]

FINDINGS: Normal sized heart. Clear lungs. The lungs remain hyperexpanded.
Bilateral costal cartilage calcifications. Diffuse osteopenia.
IMPRESSION: No acute abnormality. Stable changes of COPD.

## 2020-10-05 DIAGNOSIS — N183 Chronic kidney disease, stage 3 unspecified: Secondary | ICD-10-CM | POA: Diagnosis not present

## 2020-10-05 DIAGNOSIS — H9193 Unspecified hearing loss, bilateral: Secondary | ICD-10-CM | POA: Diagnosis not present

## 2020-10-05 DIAGNOSIS — W19XXXD Unspecified fall, subsequent encounter: Secondary | ICD-10-CM | POA: Diagnosis not present

## 2020-10-05 DIAGNOSIS — F32A Depression, unspecified: Secondary | ICD-10-CM | POA: Diagnosis not present

## 2020-10-05 DIAGNOSIS — F419 Anxiety disorder, unspecified: Secondary | ICD-10-CM | POA: Diagnosis not present

## 2020-10-05 DIAGNOSIS — I951 Orthostatic hypotension: Secondary | ICD-10-CM | POA: Diagnosis not present

## 2020-10-05 DIAGNOSIS — F0390 Unspecified dementia without behavioral disturbance: Secondary | ICD-10-CM | POA: Diagnosis not present

## 2020-10-05 DIAGNOSIS — M810 Age-related osteoporosis without current pathological fracture: Secondary | ICD-10-CM | POA: Diagnosis not present

## 2020-10-05 DIAGNOSIS — H3530 Unspecified macular degeneration: Secondary | ICD-10-CM | POA: Diagnosis not present

## 2020-10-05 DIAGNOSIS — M1991 Primary osteoarthritis, unspecified site: Secondary | ICD-10-CM | POA: Diagnosis not present

## 2020-10-07 DIAGNOSIS — R55 Syncope and collapse: Secondary | ICD-10-CM | POA: Diagnosis not present

## 2020-10-07 DIAGNOSIS — R9082 White matter disease, unspecified: Secondary | ICD-10-CM | POA: Diagnosis not present

## 2020-10-07 DIAGNOSIS — R410 Disorientation, unspecified: Secondary | ICD-10-CM | POA: Diagnosis not present

## 2020-10-12 DIAGNOSIS — M810 Age-related osteoporosis without current pathological fracture: Secondary | ICD-10-CM | POA: Diagnosis not present

## 2020-10-12 DIAGNOSIS — N183 Chronic kidney disease, stage 3 unspecified: Secondary | ICD-10-CM | POA: Diagnosis not present

## 2020-10-12 DIAGNOSIS — H3530 Unspecified macular degeneration: Secondary | ICD-10-CM | POA: Diagnosis not present

## 2020-10-12 DIAGNOSIS — I951 Orthostatic hypotension: Secondary | ICD-10-CM | POA: Diagnosis not present

## 2020-10-12 DIAGNOSIS — W19XXXD Unspecified fall, subsequent encounter: Secondary | ICD-10-CM | POA: Diagnosis not present

## 2020-10-12 DIAGNOSIS — H9193 Unspecified hearing loss, bilateral: Secondary | ICD-10-CM | POA: Diagnosis not present

## 2020-10-12 DIAGNOSIS — F32A Depression, unspecified: Secondary | ICD-10-CM | POA: Diagnosis not present

## 2020-10-12 DIAGNOSIS — F419 Anxiety disorder, unspecified: Secondary | ICD-10-CM | POA: Diagnosis not present

## 2020-10-12 DIAGNOSIS — M1991 Primary osteoarthritis, unspecified site: Secondary | ICD-10-CM | POA: Diagnosis not present

## 2020-10-12 DIAGNOSIS — F0390 Unspecified dementia without behavioral disturbance: Secondary | ICD-10-CM | POA: Diagnosis not present

## 2020-11-08 DIAGNOSIS — M810 Age-related osteoporosis without current pathological fracture: Secondary | ICD-10-CM | POA: Diagnosis not present

## 2020-11-08 DIAGNOSIS — Z87310 Personal history of (healed) osteoporosis fracture: Secondary | ICD-10-CM | POA: Diagnosis not present

## 2020-11-08 DIAGNOSIS — Z7189 Other specified counseling: Secondary | ICD-10-CM | POA: Diagnosis not present

## 2020-11-29 DIAGNOSIS — H6591 Unspecified nonsuppurative otitis media, right ear: Secondary | ICD-10-CM | POA: Diagnosis not present

## 2020-12-16 DIAGNOSIS — I10 Essential (primary) hypertension: Secondary | ICD-10-CM | POA: Diagnosis not present

## 2020-12-16 DIAGNOSIS — R0602 Shortness of breath: Secondary | ICD-10-CM | POA: Diagnosis not present

## 2021-01-09 DIAGNOSIS — M5137 Other intervertebral disc degeneration, lumbosacral region: Secondary | ICD-10-CM | POA: Diagnosis not present

## 2021-01-09 DIAGNOSIS — I10 Essential (primary) hypertension: Secondary | ICD-10-CM | POA: Diagnosis not present

## 2021-01-09 DIAGNOSIS — Z7722 Contact with and (suspected) exposure to environmental tobacco smoke (acute) (chronic): Secondary | ICD-10-CM | POA: Diagnosis not present

## 2021-01-09 DIAGNOSIS — S50812A Abrasion of left forearm, initial encounter: Secondary | ICD-10-CM | POA: Diagnosis not present

## 2021-01-09 DIAGNOSIS — R52 Pain, unspecified: Secondary | ICD-10-CM | POA: Diagnosis not present

## 2021-01-09 DIAGNOSIS — S60512A Abrasion of left hand, initial encounter: Secondary | ICD-10-CM | POA: Diagnosis not present

## 2021-01-09 DIAGNOSIS — S60222A Contusion of left hand, initial encounter: Secondary | ICD-10-CM | POA: Diagnosis not present

## 2021-01-09 DIAGNOSIS — R0902 Hypoxemia: Secondary | ICD-10-CM | POA: Diagnosis not present

## 2021-01-09 DIAGNOSIS — Y998 Other external cause status: Secondary | ICD-10-CM | POA: Diagnosis not present

## 2021-01-09 DIAGNOSIS — W19XXXA Unspecified fall, initial encounter: Secondary | ICD-10-CM | POA: Diagnosis not present

## 2021-01-09 DIAGNOSIS — M25511 Pain in right shoulder: Secondary | ICD-10-CM | POA: Diagnosis not present

## 2021-01-09 DIAGNOSIS — S40022A Contusion of left upper arm, initial encounter: Secondary | ICD-10-CM | POA: Diagnosis not present

## 2021-01-09 DIAGNOSIS — Z043 Encounter for examination and observation following other accident: Secondary | ICD-10-CM | POA: Diagnosis not present

## 2021-01-21 DIAGNOSIS — F411 Generalized anxiety disorder: Secondary | ICD-10-CM | POA: Diagnosis not present

## 2021-01-21 DIAGNOSIS — I10 Essential (primary) hypertension: Secondary | ICD-10-CM | POA: Diagnosis not present

## 2021-01-21 DIAGNOSIS — W19XXXA Unspecified fall, initial encounter: Secondary | ICD-10-CM | POA: Diagnosis not present

## 2021-01-22 DIAGNOSIS — R0602 Shortness of breath: Secondary | ICD-10-CM | POA: Diagnosis not present

## 2021-01-28 DIAGNOSIS — R2681 Unsteadiness on feet: Secondary | ICD-10-CM | POA: Diagnosis not present

## 2021-01-28 DIAGNOSIS — R531 Weakness: Secondary | ICD-10-CM | POA: Diagnosis not present

## 2021-01-28 DIAGNOSIS — R262 Difficulty in walking, not elsewhere classified: Secondary | ICD-10-CM | POA: Diagnosis not present

## 2021-01-28 DIAGNOSIS — R296 Repeated falls: Secondary | ICD-10-CM | POA: Diagnosis not present

## 2021-02-05 DIAGNOSIS — R531 Weakness: Secondary | ICD-10-CM | POA: Diagnosis not present

## 2021-02-05 DIAGNOSIS — R262 Difficulty in walking, not elsewhere classified: Secondary | ICD-10-CM | POA: Diagnosis not present

## 2021-02-05 DIAGNOSIS — R2681 Unsteadiness on feet: Secondary | ICD-10-CM | POA: Diagnosis not present

## 2021-02-05 DIAGNOSIS — R296 Repeated falls: Secondary | ICD-10-CM | POA: Diagnosis not present

## 2021-02-05 DIAGNOSIS — W19XXXD Unspecified fall, subsequent encounter: Secondary | ICD-10-CM | POA: Diagnosis not present

## 2021-02-07 DIAGNOSIS — H02834 Dermatochalasis of left upper eyelid: Secondary | ICD-10-CM | POA: Diagnosis not present

## 2021-02-07 DIAGNOSIS — H5212 Myopia, left eye: Secondary | ICD-10-CM | POA: Diagnosis not present

## 2021-02-07 DIAGNOSIS — H524 Presbyopia: Secondary | ICD-10-CM | POA: Diagnosis not present

## 2021-02-07 DIAGNOSIS — H02831 Dermatochalasis of right upper eyelid: Secondary | ICD-10-CM | POA: Diagnosis not present

## 2021-02-07 DIAGNOSIS — H43393 Other vitreous opacities, bilateral: Secondary | ICD-10-CM | POA: Diagnosis not present

## 2021-02-07 DIAGNOSIS — H04123 Dry eye syndrome of bilateral lacrimal glands: Secondary | ICD-10-CM | POA: Diagnosis not present

## 2021-02-07 DIAGNOSIS — H52203 Unspecified astigmatism, bilateral: Secondary | ICD-10-CM | POA: Diagnosis not present

## 2021-02-07 DIAGNOSIS — H18523 Epithelial (juvenile) corneal dystrophy, bilateral: Secondary | ICD-10-CM | POA: Diagnosis not present

## 2021-02-07 DIAGNOSIS — H35033 Hypertensive retinopathy, bilateral: Secondary | ICD-10-CM | POA: Diagnosis not present

## 2021-02-07 DIAGNOSIS — H5201 Hypermetropia, right eye: Secondary | ICD-10-CM | POA: Diagnosis not present

## 2021-02-07 DIAGNOSIS — H35363 Drusen (degenerative) of macula, bilateral: Secondary | ICD-10-CM | POA: Diagnosis not present

## 2021-02-07 DIAGNOSIS — H353132 Nonexudative age-related macular degeneration, bilateral, intermediate dry stage: Secondary | ICD-10-CM | POA: Diagnosis not present

## 2021-02-10 DIAGNOSIS — R2681 Unsteadiness on feet: Secondary | ICD-10-CM | POA: Diagnosis not present

## 2021-02-10 DIAGNOSIS — R531 Weakness: Secondary | ICD-10-CM | POA: Diagnosis not present

## 2021-02-10 DIAGNOSIS — W19XXXD Unspecified fall, subsequent encounter: Secondary | ICD-10-CM | POA: Diagnosis not present

## 2021-02-10 DIAGNOSIS — R262 Difficulty in walking, not elsewhere classified: Secondary | ICD-10-CM | POA: Diagnosis not present

## 2021-02-10 DIAGNOSIS — R296 Repeated falls: Secondary | ICD-10-CM | POA: Diagnosis not present

## 2021-02-12 DIAGNOSIS — R296 Repeated falls: Secondary | ICD-10-CM | POA: Diagnosis not present

## 2021-02-12 DIAGNOSIS — R2681 Unsteadiness on feet: Secondary | ICD-10-CM | POA: Diagnosis not present

## 2021-02-12 DIAGNOSIS — R531 Weakness: Secondary | ICD-10-CM | POA: Diagnosis not present

## 2021-02-12 DIAGNOSIS — W19XXXD Unspecified fall, subsequent encounter: Secondary | ICD-10-CM | POA: Diagnosis not present

## 2021-02-12 DIAGNOSIS — R262 Difficulty in walking, not elsewhere classified: Secondary | ICD-10-CM | POA: Diagnosis not present

## 2021-02-19 DIAGNOSIS — R2681 Unsteadiness on feet: Secondary | ICD-10-CM | POA: Diagnosis not present

## 2021-02-19 DIAGNOSIS — R296 Repeated falls: Secondary | ICD-10-CM | POA: Diagnosis not present

## 2021-02-19 DIAGNOSIS — R531 Weakness: Secondary | ICD-10-CM | POA: Diagnosis not present

## 2021-02-19 DIAGNOSIS — R262 Difficulty in walking, not elsewhere classified: Secondary | ICD-10-CM | POA: Diagnosis not present

## 2021-02-19 DIAGNOSIS — W19XXXD Unspecified fall, subsequent encounter: Secondary | ICD-10-CM | POA: Diagnosis not present

## 2021-02-25 DIAGNOSIS — R531 Weakness: Secondary | ICD-10-CM | POA: Diagnosis not present

## 2021-02-25 DIAGNOSIS — R2681 Unsteadiness on feet: Secondary | ICD-10-CM | POA: Diagnosis not present

## 2021-02-25 DIAGNOSIS — W19XXXD Unspecified fall, subsequent encounter: Secondary | ICD-10-CM | POA: Diagnosis not present

## 2021-02-25 DIAGNOSIS — R262 Difficulty in walking, not elsewhere classified: Secondary | ICD-10-CM | POA: Diagnosis not present

## 2021-02-25 DIAGNOSIS — R296 Repeated falls: Secondary | ICD-10-CM | POA: Diagnosis not present

## 2021-02-26 DIAGNOSIS — R296 Repeated falls: Secondary | ICD-10-CM | POA: Diagnosis not present

## 2021-02-26 DIAGNOSIS — W19XXXD Unspecified fall, subsequent encounter: Secondary | ICD-10-CM | POA: Diagnosis not present

## 2021-02-26 DIAGNOSIS — R2681 Unsteadiness on feet: Secondary | ICD-10-CM | POA: Diagnosis not present

## 2021-02-26 DIAGNOSIS — R531 Weakness: Secondary | ICD-10-CM | POA: Diagnosis not present

## 2021-02-26 DIAGNOSIS — R262 Difficulty in walking, not elsewhere classified: Secondary | ICD-10-CM | POA: Diagnosis not present

## 2021-03-05 DIAGNOSIS — R531 Weakness: Secondary | ICD-10-CM | POA: Diagnosis not present

## 2021-03-05 DIAGNOSIS — W19XXXD Unspecified fall, subsequent encounter: Secondary | ICD-10-CM | POA: Diagnosis not present

## 2021-03-05 DIAGNOSIS — R296 Repeated falls: Secondary | ICD-10-CM | POA: Diagnosis not present

## 2021-03-05 DIAGNOSIS — R262 Difficulty in walking, not elsewhere classified: Secondary | ICD-10-CM | POA: Diagnosis not present

## 2021-03-05 DIAGNOSIS — R2681 Unsteadiness on feet: Secondary | ICD-10-CM | POA: Diagnosis not present

## 2021-03-12 DIAGNOSIS — W19XXXD Unspecified fall, subsequent encounter: Secondary | ICD-10-CM | POA: Diagnosis not present

## 2021-03-12 DIAGNOSIS — R2681 Unsteadiness on feet: Secondary | ICD-10-CM | POA: Diagnosis not present

## 2021-03-12 DIAGNOSIS — R531 Weakness: Secondary | ICD-10-CM | POA: Diagnosis not present

## 2021-03-12 DIAGNOSIS — R262 Difficulty in walking, not elsewhere classified: Secondary | ICD-10-CM | POA: Diagnosis not present

## 2021-03-12 DIAGNOSIS — R296 Repeated falls: Secondary | ICD-10-CM | POA: Diagnosis not present

## 2021-03-17 DIAGNOSIS — R262 Difficulty in walking, not elsewhere classified: Secondary | ICD-10-CM | POA: Diagnosis not present

## 2021-03-17 DIAGNOSIS — R531 Weakness: Secondary | ICD-10-CM | POA: Diagnosis not present

## 2021-03-17 DIAGNOSIS — W19XXXD Unspecified fall, subsequent encounter: Secondary | ICD-10-CM | POA: Diagnosis not present

## 2021-03-17 DIAGNOSIS — R2681 Unsteadiness on feet: Secondary | ICD-10-CM | POA: Diagnosis not present

## 2021-03-17 DIAGNOSIS — R296 Repeated falls: Secondary | ICD-10-CM | POA: Diagnosis not present

## 2021-03-19 DIAGNOSIS — R262 Difficulty in walking, not elsewhere classified: Secondary | ICD-10-CM | POA: Diagnosis not present

## 2021-03-19 DIAGNOSIS — R296 Repeated falls: Secondary | ICD-10-CM | POA: Diagnosis not present

## 2021-03-19 DIAGNOSIS — W19XXXD Unspecified fall, subsequent encounter: Secondary | ICD-10-CM | POA: Diagnosis not present

## 2021-03-19 DIAGNOSIS — R2681 Unsteadiness on feet: Secondary | ICD-10-CM | POA: Diagnosis not present

## 2021-03-19 DIAGNOSIS — R531 Weakness: Secondary | ICD-10-CM | POA: Diagnosis not present

## 2021-03-20 DIAGNOSIS — F411 Generalized anxiety disorder: Secondary | ICD-10-CM | POA: Diagnosis not present

## 2021-03-20 DIAGNOSIS — R002 Palpitations: Secondary | ICD-10-CM | POA: Diagnosis not present

## 2021-03-20 DIAGNOSIS — I10 Essential (primary) hypertension: Secondary | ICD-10-CM | POA: Diagnosis not present

## 2021-03-20 DIAGNOSIS — Z0000 Encounter for general adult medical examination without abnormal findings: Secondary | ICD-10-CM | POA: Diagnosis not present

## 2021-03-20 DIAGNOSIS — F3341 Major depressive disorder, recurrent, in partial remission: Secondary | ICD-10-CM | POA: Diagnosis not present

## 2021-03-20 DIAGNOSIS — Z23 Encounter for immunization: Secondary | ICD-10-CM | POA: Diagnosis not present

## 2021-03-25 DIAGNOSIS — R002 Palpitations: Secondary | ICD-10-CM | POA: Diagnosis not present

## 2021-03-25 DIAGNOSIS — R42 Dizziness and giddiness: Secondary | ICD-10-CM | POA: Diagnosis not present

## 2021-04-02 DIAGNOSIS — W19XXXD Unspecified fall, subsequent encounter: Secondary | ICD-10-CM | POA: Diagnosis not present

## 2021-04-02 DIAGNOSIS — R2681 Unsteadiness on feet: Secondary | ICD-10-CM | POA: Diagnosis not present

## 2021-04-02 DIAGNOSIS — R296 Repeated falls: Secondary | ICD-10-CM | POA: Diagnosis not present

## 2021-04-02 DIAGNOSIS — R531 Weakness: Secondary | ICD-10-CM | POA: Diagnosis not present

## 2021-04-02 DIAGNOSIS — R262 Difficulty in walking, not elsewhere classified: Secondary | ICD-10-CM | POA: Diagnosis not present

## 2021-04-07 DIAGNOSIS — W19XXXD Unspecified fall, subsequent encounter: Secondary | ICD-10-CM | POA: Diagnosis not present

## 2021-04-07 DIAGNOSIS — R2681 Unsteadiness on feet: Secondary | ICD-10-CM | POA: Diagnosis not present

## 2021-04-07 DIAGNOSIS — R296 Repeated falls: Secondary | ICD-10-CM | POA: Diagnosis not present

## 2021-04-07 DIAGNOSIS — R531 Weakness: Secondary | ICD-10-CM | POA: Diagnosis not present

## 2021-04-07 DIAGNOSIS — R262 Difficulty in walking, not elsewhere classified: Secondary | ICD-10-CM | POA: Diagnosis not present

## 2021-04-09 DIAGNOSIS — R296 Repeated falls: Secondary | ICD-10-CM | POA: Diagnosis not present

## 2021-04-09 DIAGNOSIS — R262 Difficulty in walking, not elsewhere classified: Secondary | ICD-10-CM | POA: Diagnosis not present

## 2021-04-09 DIAGNOSIS — R2681 Unsteadiness on feet: Secondary | ICD-10-CM | POA: Diagnosis not present

## 2021-04-09 DIAGNOSIS — W19XXXD Unspecified fall, subsequent encounter: Secondary | ICD-10-CM | POA: Diagnosis not present

## 2021-04-09 DIAGNOSIS — R531 Weakness: Secondary | ICD-10-CM | POA: Diagnosis not present

## 2021-04-10 DIAGNOSIS — R002 Palpitations: Secondary | ICD-10-CM | POA: Diagnosis not present

## 2021-04-13 DIAGNOSIS — I471 Supraventricular tachycardia: Secondary | ICD-10-CM | POA: Diagnosis not present

## 2021-04-14 DIAGNOSIS — R262 Difficulty in walking, not elsewhere classified: Secondary | ICD-10-CM | POA: Diagnosis not present

## 2021-04-14 DIAGNOSIS — R296 Repeated falls: Secondary | ICD-10-CM | POA: Diagnosis not present

## 2021-04-14 DIAGNOSIS — R2681 Unsteadiness on feet: Secondary | ICD-10-CM | POA: Diagnosis not present

## 2021-04-14 DIAGNOSIS — W19XXXD Unspecified fall, subsequent encounter: Secondary | ICD-10-CM | POA: Diagnosis not present

## 2021-04-14 DIAGNOSIS — R531 Weakness: Secondary | ICD-10-CM | POA: Diagnosis not present

## 2021-04-21 DIAGNOSIS — R296 Repeated falls: Secondary | ICD-10-CM | POA: Diagnosis not present

## 2021-04-21 DIAGNOSIS — R2681 Unsteadiness on feet: Secondary | ICD-10-CM | POA: Diagnosis not present

## 2021-04-21 DIAGNOSIS — R531 Weakness: Secondary | ICD-10-CM | POA: Diagnosis not present

## 2021-04-21 DIAGNOSIS — W19XXXD Unspecified fall, subsequent encounter: Secondary | ICD-10-CM | POA: Diagnosis not present

## 2021-04-21 DIAGNOSIS — R262 Difficulty in walking, not elsewhere classified: Secondary | ICD-10-CM | POA: Diagnosis not present

## 2021-04-30 DIAGNOSIS — R296 Repeated falls: Secondary | ICD-10-CM | POA: Diagnosis not present

## 2021-04-30 DIAGNOSIS — R531 Weakness: Secondary | ICD-10-CM | POA: Diagnosis not present

## 2021-04-30 DIAGNOSIS — R262 Difficulty in walking, not elsewhere classified: Secondary | ICD-10-CM | POA: Diagnosis not present

## 2021-04-30 DIAGNOSIS — R2681 Unsteadiness on feet: Secondary | ICD-10-CM | POA: Diagnosis not present

## 2021-04-30 DIAGNOSIS — W19XXXD Unspecified fall, subsequent encounter: Secondary | ICD-10-CM | POA: Diagnosis not present

## 2021-05-07 DIAGNOSIS — R262 Difficulty in walking, not elsewhere classified: Secondary | ICD-10-CM | POA: Diagnosis not present

## 2021-05-07 DIAGNOSIS — R296 Repeated falls: Secondary | ICD-10-CM | POA: Diagnosis not present

## 2021-05-07 DIAGNOSIS — W19XXXD Unspecified fall, subsequent encounter: Secondary | ICD-10-CM | POA: Diagnosis not present

## 2021-05-07 DIAGNOSIS — R2681 Unsteadiness on feet: Secondary | ICD-10-CM | POA: Diagnosis not present

## 2021-05-07 DIAGNOSIS — R531 Weakness: Secondary | ICD-10-CM | POA: Diagnosis not present

## 2021-05-22 DIAGNOSIS — R2681 Unsteadiness on feet: Secondary | ICD-10-CM | POA: Diagnosis not present

## 2021-05-22 DIAGNOSIS — R531 Weakness: Secondary | ICD-10-CM | POA: Diagnosis not present

## 2021-05-22 DIAGNOSIS — R262 Difficulty in walking, not elsewhere classified: Secondary | ICD-10-CM | POA: Diagnosis not present

## 2021-05-22 DIAGNOSIS — W19XXXD Unspecified fall, subsequent encounter: Secondary | ICD-10-CM | POA: Diagnosis not present

## 2021-05-22 DIAGNOSIS — R296 Repeated falls: Secondary | ICD-10-CM | POA: Diagnosis not present

## 2021-05-27 DIAGNOSIS — R531 Weakness: Secondary | ICD-10-CM | POA: Diagnosis not present

## 2021-05-27 DIAGNOSIS — W19XXXD Unspecified fall, subsequent encounter: Secondary | ICD-10-CM | POA: Diagnosis not present

## 2021-05-27 DIAGNOSIS — R296 Repeated falls: Secondary | ICD-10-CM | POA: Diagnosis not present

## 2021-05-27 DIAGNOSIS — R262 Difficulty in walking, not elsewhere classified: Secondary | ICD-10-CM | POA: Diagnosis not present

## 2021-05-27 DIAGNOSIS — R2681 Unsteadiness on feet: Secondary | ICD-10-CM | POA: Diagnosis not present

## 2021-06-02 DIAGNOSIS — Z87310 Personal history of (healed) osteoporosis fracture: Secondary | ICD-10-CM | POA: Diagnosis not present

## 2021-06-02 DIAGNOSIS — Z7189 Other specified counseling: Secondary | ICD-10-CM | POA: Diagnosis not present

## 2021-06-02 DIAGNOSIS — M810 Age-related osteoporosis without current pathological fracture: Secondary | ICD-10-CM | POA: Diagnosis not present

## 2021-06-04 DIAGNOSIS — R296 Repeated falls: Secondary | ICD-10-CM | POA: Diagnosis not present

## 2021-06-04 DIAGNOSIS — R2681 Unsteadiness on feet: Secondary | ICD-10-CM | POA: Diagnosis not present

## 2021-06-04 DIAGNOSIS — R262 Difficulty in walking, not elsewhere classified: Secondary | ICD-10-CM | POA: Diagnosis not present

## 2021-06-04 DIAGNOSIS — R531 Weakness: Secondary | ICD-10-CM | POA: Diagnosis not present

## 2021-06-04 DIAGNOSIS — W19XXXD Unspecified fall, subsequent encounter: Secondary | ICD-10-CM | POA: Diagnosis not present

## 2021-06-12 DIAGNOSIS — I471 Supraventricular tachycardia: Secondary | ICD-10-CM | POA: Diagnosis not present

## 2021-06-12 DIAGNOSIS — I951 Orthostatic hypotension: Secondary | ICD-10-CM | POA: Diagnosis not present

## 2021-06-12 DIAGNOSIS — I10 Essential (primary) hypertension: Secondary | ICD-10-CM | POA: Diagnosis not present

## 2021-06-23 DIAGNOSIS — R944 Abnormal results of kidney function studies: Secondary | ICD-10-CM | POA: Diagnosis not present

## 2021-06-25 DIAGNOSIS — H18523 Epithelial (juvenile) corneal dystrophy, bilateral: Secondary | ICD-10-CM | POA: Diagnosis not present

## 2021-06-25 DIAGNOSIS — H353132 Nonexudative age-related macular degeneration, bilateral, intermediate dry stage: Secondary | ICD-10-CM | POA: Diagnosis not present

## 2021-06-25 DIAGNOSIS — H35363 Drusen (degenerative) of macula, bilateral: Secondary | ICD-10-CM | POA: Diagnosis not present

## 2021-06-25 DIAGNOSIS — H04123 Dry eye syndrome of bilateral lacrimal glands: Secondary | ICD-10-CM | POA: Diagnosis not present

## 2022-07-22 DIAGNOSIS — D492 Neoplasm of unspecified behavior of bone, soft tissue, and skin: Secondary | ICD-10-CM | POA: Diagnosis not present

## 2022-07-22 DIAGNOSIS — I700 Atherosclerosis of aorta: Secondary | ICD-10-CM | POA: Diagnosis not present

## 2022-07-22 DIAGNOSIS — F0390 Unspecified dementia without behavioral disturbance: Secondary | ICD-10-CM | POA: Diagnosis not present

## 2022-07-22 DIAGNOSIS — R5383 Other fatigue: Secondary | ICD-10-CM | POA: Diagnosis not present

## 2022-07-22 DIAGNOSIS — I471 Supraventricular tachycardia, unspecified: Secondary | ICD-10-CM | POA: Diagnosis not present

## 2022-07-22 DIAGNOSIS — Z862 Personal history of diseases of the blood and blood-forming organs and certain disorders involving the immune mechanism: Secondary | ICD-10-CM | POA: Diagnosis not present

## 2022-07-22 DIAGNOSIS — F3341 Major depressive disorder, recurrent, in partial remission: Secondary | ICD-10-CM | POA: Diagnosis not present

## 2022-07-22 DIAGNOSIS — R410 Disorientation, unspecified: Secondary | ICD-10-CM | POA: Diagnosis not present

## 2022-07-22 DIAGNOSIS — I129 Hypertensive chronic kidney disease with stage 1 through stage 4 chronic kidney disease, or unspecified chronic kidney disease: Secondary | ICD-10-CM | POA: Diagnosis not present

## 2022-07-22 DIAGNOSIS — E441 Mild protein-calorie malnutrition: Secondary | ICD-10-CM | POA: Diagnosis not present

## 2022-07-22 DIAGNOSIS — N1832 Chronic kidney disease, stage 3b: Secondary | ICD-10-CM | POA: Diagnosis not present

## 2022-07-29 DIAGNOSIS — L570 Actinic keratosis: Secondary | ICD-10-CM | POA: Diagnosis not present

## 2022-07-29 DIAGNOSIS — L821 Other seborrheic keratosis: Secondary | ICD-10-CM | POA: Diagnosis not present

## 2022-08-04 DIAGNOSIS — M810 Age-related osteoporosis without current pathological fracture: Secondary | ICD-10-CM | POA: Diagnosis not present

## 2022-08-04 DIAGNOSIS — N289 Disorder of kidney and ureter, unspecified: Secondary | ICD-10-CM | POA: Diagnosis not present

## 2022-08-04 DIAGNOSIS — R5383 Other fatigue: Secondary | ICD-10-CM | POA: Diagnosis not present

## 2022-08-04 DIAGNOSIS — D649 Anemia, unspecified: Secondary | ICD-10-CM | POA: Diagnosis not present

## 2022-08-04 DIAGNOSIS — R911 Solitary pulmonary nodule: Secondary | ICD-10-CM | POA: Diagnosis not present

## 2022-08-04 DIAGNOSIS — Z79899 Other long term (current) drug therapy: Secondary | ICD-10-CM | POA: Diagnosis not present

## 2022-08-04 DIAGNOSIS — R3129 Other microscopic hematuria: Secondary | ICD-10-CM | POA: Diagnosis not present

## 2022-08-20 DIAGNOSIS — I2510 Atherosclerotic heart disease of native coronary artery without angina pectoris: Secondary | ICD-10-CM | POA: Diagnosis not present

## 2022-08-20 DIAGNOSIS — I700 Atherosclerosis of aorta: Secondary | ICD-10-CM | POA: Diagnosis not present

## 2022-08-20 DIAGNOSIS — R911 Solitary pulmonary nodule: Secondary | ICD-10-CM | POA: Diagnosis not present

## 2022-08-27 DIAGNOSIS — U071 COVID-19: Secondary | ICD-10-CM | POA: Diagnosis not present

## 2022-08-27 DIAGNOSIS — J40 Bronchitis, not specified as acute or chronic: Secondary | ICD-10-CM | POA: Diagnosis not present

## 2022-08-27 DIAGNOSIS — J4520 Mild intermittent asthma, uncomplicated: Secondary | ICD-10-CM | POA: Diagnosis not present

## 2022-09-09 DIAGNOSIS — F03B0 Unspecified dementia, moderate, without behavioral disturbance, psychotic disturbance, mood disturbance, and anxiety: Secondary | ICD-10-CM | POA: Diagnosis not present

## 2022-09-09 DIAGNOSIS — Z13220 Encounter for screening for lipoid disorders: Secondary | ICD-10-CM | POA: Diagnosis not present

## 2022-09-09 DIAGNOSIS — Z23 Encounter for immunization: Secondary | ICD-10-CM | POA: Diagnosis not present

## 2022-09-09 DIAGNOSIS — F3341 Major depressive disorder, recurrent, in partial remission: Secondary | ICD-10-CM | POA: Diagnosis not present

## 2022-09-09 DIAGNOSIS — Z0000 Encounter for general adult medical examination without abnormal findings: Secondary | ICD-10-CM | POA: Diagnosis not present

## 2022-09-09 DIAGNOSIS — N1832 Chronic kidney disease, stage 3b: Secondary | ICD-10-CM | POA: Diagnosis not present

## 2022-09-09 DIAGNOSIS — I129 Hypertensive chronic kidney disease with stage 1 through stage 4 chronic kidney disease, or unspecified chronic kidney disease: Secondary | ICD-10-CM | POA: Diagnosis not present

## 2022-09-09 DIAGNOSIS — I10 Essential (primary) hypertension: Secondary | ICD-10-CM | POA: Diagnosis not present

## 2022-09-09 DIAGNOSIS — I471 Supraventricular tachycardia, unspecified: Secondary | ICD-10-CM | POA: Diagnosis not present

## 2022-10-21 DIAGNOSIS — M159 Polyosteoarthritis, unspecified: Secondary | ICD-10-CM | POA: Diagnosis not present

## 2022-10-21 DIAGNOSIS — M160 Bilateral primary osteoarthritis of hip: Secondary | ICD-10-CM | POA: Diagnosis not present

## 2022-10-21 DIAGNOSIS — M5136 Other intervertebral disc degeneration, lumbar region: Secondary | ICD-10-CM | POA: Diagnosis not present

## 2022-10-21 DIAGNOSIS — H524 Presbyopia: Secondary | ICD-10-CM | POA: Diagnosis not present

## 2022-10-21 DIAGNOSIS — M67814 Other specified disorders of tendon, left shoulder: Secondary | ICD-10-CM | POA: Diagnosis not present

## 2022-10-21 DIAGNOSIS — M25551 Pain in right hip: Secondary | ICD-10-CM | POA: Diagnosis not present

## 2022-10-21 DIAGNOSIS — Z9181 History of falling: Secondary | ICD-10-CM | POA: Diagnosis not present

## 2022-10-21 DIAGNOSIS — S51012A Laceration without foreign body of left elbow, initial encounter: Secondary | ICD-10-CM | POA: Diagnosis not present

## 2022-10-21 DIAGNOSIS — H35033 Hypertensive retinopathy, bilateral: Secondary | ICD-10-CM | POA: Diagnosis not present

## 2022-10-21 DIAGNOSIS — M545 Low back pain, unspecified: Secondary | ICD-10-CM | POA: Diagnosis not present

## 2022-10-21 DIAGNOSIS — R079 Chest pain, unspecified: Secondary | ICD-10-CM | POA: Diagnosis not present

## 2022-10-21 DIAGNOSIS — W19XXXA Unspecified fall, initial encounter: Secondary | ICD-10-CM | POA: Diagnosis not present

## 2022-10-21 DIAGNOSIS — H43393 Other vitreous opacities, bilateral: Secondary | ICD-10-CM | POA: Diagnosis not present

## 2022-10-21 DIAGNOSIS — H18513 Endothelial corneal dystrophy, bilateral: Secondary | ICD-10-CM | POA: Diagnosis not present

## 2022-10-21 DIAGNOSIS — H0288B Meibomian gland dysfunction left eye, upper and lower eyelids: Secondary | ICD-10-CM | POA: Diagnosis not present

## 2022-10-21 DIAGNOSIS — H35363 Drusen (degenerative) of macula, bilateral: Secondary | ICD-10-CM | POA: Diagnosis not present

## 2022-10-21 DIAGNOSIS — L988 Other specified disorders of the skin and subcutaneous tissue: Secondary | ICD-10-CM | POA: Diagnosis not present

## 2022-10-21 DIAGNOSIS — H04123 Dry eye syndrome of bilateral lacrimal glands: Secondary | ICD-10-CM | POA: Diagnosis not present

## 2022-10-21 DIAGNOSIS — H02831 Dermatochalasis of right upper eyelid: Secondary | ICD-10-CM | POA: Diagnosis not present

## 2022-10-21 DIAGNOSIS — S0990XA Unspecified injury of head, initial encounter: Secondary | ICD-10-CM | POA: Diagnosis not present

## 2022-10-21 DIAGNOSIS — H52203 Unspecified astigmatism, bilateral: Secondary | ICD-10-CM | POA: Diagnosis not present

## 2022-10-21 DIAGNOSIS — R519 Headache, unspecified: Secondary | ICD-10-CM | POA: Diagnosis not present

## 2022-10-21 DIAGNOSIS — M542 Cervicalgia: Secondary | ICD-10-CM | POA: Diagnosis not present

## 2022-10-21 DIAGNOSIS — H0288A Meibomian gland dysfunction right eye, upper and lower eyelids: Secondary | ICD-10-CM | POA: Diagnosis not present

## 2022-10-21 DIAGNOSIS — H353132 Nonexudative age-related macular degeneration, bilateral, intermediate dry stage: Secondary | ICD-10-CM | POA: Diagnosis not present

## 2022-10-21 DIAGNOSIS — H02834 Dermatochalasis of left upper eyelid: Secondary | ICD-10-CM | POA: Diagnosis not present

## 2022-10-21 DIAGNOSIS — F0390 Unspecified dementia without behavioral disturbance: Secondary | ICD-10-CM | POA: Diagnosis not present

## 2022-10-21 DIAGNOSIS — M25512 Pain in left shoulder: Secondary | ICD-10-CM | POA: Diagnosis not present

## 2022-10-21 DIAGNOSIS — R270 Ataxia, unspecified: Secondary | ICD-10-CM | POA: Diagnosis not present

## 2022-10-21 DIAGNOSIS — Z043 Encounter for examination and observation following other accident: Secondary | ICD-10-CM | POA: Diagnosis not present

## 2022-10-21 DIAGNOSIS — M79602 Pain in left arm: Secondary | ICD-10-CM | POA: Diagnosis not present

## 2022-10-22 DIAGNOSIS — I517 Cardiomegaly: Secondary | ICD-10-CM | POA: Diagnosis not present

## 2022-11-03 DIAGNOSIS — L570 Actinic keratosis: Secondary | ICD-10-CM | POA: Diagnosis not present

## 2022-11-03 DIAGNOSIS — L989 Disorder of the skin and subcutaneous tissue, unspecified: Secondary | ICD-10-CM | POA: Diagnosis not present

## 2022-11-05 DIAGNOSIS — I10 Essential (primary) hypertension: Secondary | ICD-10-CM | POA: Diagnosis not present

## 2022-11-05 DIAGNOSIS — W19XXXD Unspecified fall, subsequent encounter: Secondary | ICD-10-CM | POA: Diagnosis not present

## 2022-11-05 DIAGNOSIS — F3341 Major depressive disorder, recurrent, in partial remission: Secondary | ICD-10-CM | POA: Diagnosis not present

## 2022-11-13 DIAGNOSIS — L570 Actinic keratosis: Secondary | ICD-10-CM | POA: Diagnosis not present

## 2022-11-13 DIAGNOSIS — L989 Disorder of the skin and subcutaneous tissue, unspecified: Secondary | ICD-10-CM | POA: Diagnosis not present

## 2023-03-09 DIAGNOSIS — R2681 Unsteadiness on feet: Secondary | ICD-10-CM | POA: Diagnosis not present

## 2023-03-09 DIAGNOSIS — W19XXXD Unspecified fall, subsequent encounter: Secondary | ICD-10-CM | POA: Diagnosis not present

## 2023-03-09 DIAGNOSIS — M6281 Muscle weakness (generalized): Secondary | ICD-10-CM | POA: Diagnosis not present

## 2023-03-15 DIAGNOSIS — I129 Hypertensive chronic kidney disease with stage 1 through stage 4 chronic kidney disease, or unspecified chronic kidney disease: Secondary | ICD-10-CM | POA: Diagnosis not present

## 2023-03-15 DIAGNOSIS — W19XXXD Unspecified fall, subsequent encounter: Secondary | ICD-10-CM | POA: Diagnosis not present

## 2023-03-15 DIAGNOSIS — F03B0 Unspecified dementia, moderate, without behavioral disturbance, psychotic disturbance, mood disturbance, and anxiety: Secondary | ICD-10-CM | POA: Diagnosis not present

## 2023-03-15 DIAGNOSIS — N1831 Chronic kidney disease, stage 3a: Secondary | ICD-10-CM | POA: Diagnosis not present

## 2023-03-15 DIAGNOSIS — F3341 Major depressive disorder, recurrent, in partial remission: Secondary | ICD-10-CM | POA: Diagnosis not present

## 2023-03-16 DIAGNOSIS — R2681 Unsteadiness on feet: Secondary | ICD-10-CM | POA: Diagnosis not present

## 2023-03-16 DIAGNOSIS — W19XXXD Unspecified fall, subsequent encounter: Secondary | ICD-10-CM | POA: Diagnosis not present

## 2023-03-16 DIAGNOSIS — M6281 Muscle weakness (generalized): Secondary | ICD-10-CM | POA: Diagnosis not present

## 2023-03-18 DIAGNOSIS — W19XXXD Unspecified fall, subsequent encounter: Secondary | ICD-10-CM | POA: Diagnosis not present

## 2023-03-18 DIAGNOSIS — M6281 Muscle weakness (generalized): Secondary | ICD-10-CM | POA: Diagnosis not present

## 2023-03-18 DIAGNOSIS — R2681 Unsteadiness on feet: Secondary | ICD-10-CM | POA: Diagnosis not present

## 2023-03-25 DIAGNOSIS — W19XXXD Unspecified fall, subsequent encounter: Secondary | ICD-10-CM | POA: Diagnosis not present

## 2023-03-25 DIAGNOSIS — M6281 Muscle weakness (generalized): Secondary | ICD-10-CM | POA: Diagnosis not present

## 2023-03-25 DIAGNOSIS — R2681 Unsteadiness on feet: Secondary | ICD-10-CM | POA: Diagnosis not present

## 2023-03-31 DIAGNOSIS — W19XXXD Unspecified fall, subsequent encounter: Secondary | ICD-10-CM | POA: Diagnosis not present

## 2023-03-31 DIAGNOSIS — R2681 Unsteadiness on feet: Secondary | ICD-10-CM | POA: Diagnosis not present

## 2023-03-31 DIAGNOSIS — M6281 Muscle weakness (generalized): Secondary | ICD-10-CM | POA: Diagnosis not present

## 2023-04-01 DIAGNOSIS — R2681 Unsteadiness on feet: Secondary | ICD-10-CM | POA: Diagnosis not present

## 2023-04-01 DIAGNOSIS — W19XXXD Unspecified fall, subsequent encounter: Secondary | ICD-10-CM | POA: Diagnosis not present

## 2023-04-01 DIAGNOSIS — M6281 Muscle weakness (generalized): Secondary | ICD-10-CM | POA: Diagnosis not present

## 2023-04-06 DIAGNOSIS — R2681 Unsteadiness on feet: Secondary | ICD-10-CM | POA: Diagnosis not present

## 2023-04-06 DIAGNOSIS — W19XXXD Unspecified fall, subsequent encounter: Secondary | ICD-10-CM | POA: Diagnosis not present

## 2023-04-06 DIAGNOSIS — M6281 Muscle weakness (generalized): Secondary | ICD-10-CM | POA: Diagnosis not present

## 2023-04-08 DIAGNOSIS — W19XXXD Unspecified fall, subsequent encounter: Secondary | ICD-10-CM | POA: Diagnosis not present

## 2023-04-08 DIAGNOSIS — R2681 Unsteadiness on feet: Secondary | ICD-10-CM | POA: Diagnosis not present

## 2023-04-08 DIAGNOSIS — M6281 Muscle weakness (generalized): Secondary | ICD-10-CM | POA: Diagnosis not present

## 2023-04-13 DIAGNOSIS — R2681 Unsteadiness on feet: Secondary | ICD-10-CM | POA: Diagnosis not present

## 2023-04-13 DIAGNOSIS — W19XXXD Unspecified fall, subsequent encounter: Secondary | ICD-10-CM | POA: Diagnosis not present

## 2023-04-13 DIAGNOSIS — M6281 Muscle weakness (generalized): Secondary | ICD-10-CM | POA: Diagnosis not present

## 2023-04-15 DIAGNOSIS — M6281 Muscle weakness (generalized): Secondary | ICD-10-CM | POA: Diagnosis not present

## 2023-04-15 DIAGNOSIS — R2681 Unsteadiness on feet: Secondary | ICD-10-CM | POA: Diagnosis not present

## 2023-04-15 DIAGNOSIS — W19XXXD Unspecified fall, subsequent encounter: Secondary | ICD-10-CM | POA: Diagnosis not present

## 2023-04-20 DIAGNOSIS — M6281 Muscle weakness (generalized): Secondary | ICD-10-CM | POA: Diagnosis not present

## 2023-04-20 DIAGNOSIS — W19XXXD Unspecified fall, subsequent encounter: Secondary | ICD-10-CM | POA: Diagnosis not present

## 2023-04-20 DIAGNOSIS — R2681 Unsteadiness on feet: Secondary | ICD-10-CM | POA: Diagnosis not present

## 2023-04-27 DIAGNOSIS — H18513 Endothelial corneal dystrophy, bilateral: Secondary | ICD-10-CM | POA: Diagnosis not present

## 2023-04-27 DIAGNOSIS — M6281 Muscle weakness (generalized): Secondary | ICD-10-CM | POA: Diagnosis not present

## 2023-04-27 DIAGNOSIS — H353132 Nonexudative age-related macular degeneration, bilateral, intermediate dry stage: Secondary | ICD-10-CM | POA: Diagnosis not present

## 2023-04-27 DIAGNOSIS — R2681 Unsteadiness on feet: Secondary | ICD-10-CM | POA: Diagnosis not present

## 2023-04-27 DIAGNOSIS — H04123 Dry eye syndrome of bilateral lacrimal glands: Secondary | ICD-10-CM | POA: Diagnosis not present

## 2023-04-27 DIAGNOSIS — H35363 Drusen (degenerative) of macula, bilateral: Secondary | ICD-10-CM | POA: Diagnosis not present

## 2023-04-27 DIAGNOSIS — W19XXXD Unspecified fall, subsequent encounter: Secondary | ICD-10-CM | POA: Diagnosis not present

## 2023-05-04 DIAGNOSIS — W19XXXD Unspecified fall, subsequent encounter: Secondary | ICD-10-CM | POA: Diagnosis not present

## 2023-05-04 DIAGNOSIS — M6281 Muscle weakness (generalized): Secondary | ICD-10-CM | POA: Diagnosis not present

## 2023-05-04 DIAGNOSIS — R2681 Unsteadiness on feet: Secondary | ICD-10-CM | POA: Diagnosis not present

## 2023-05-07 DIAGNOSIS — W19XXXD Unspecified fall, subsequent encounter: Secondary | ICD-10-CM | POA: Diagnosis not present

## 2023-05-07 DIAGNOSIS — M6281 Muscle weakness (generalized): Secondary | ICD-10-CM | POA: Diagnosis not present

## 2023-05-07 DIAGNOSIS — R2681 Unsteadiness on feet: Secondary | ICD-10-CM | POA: Diagnosis not present

## 2023-05-11 DIAGNOSIS — M6281 Muscle weakness (generalized): Secondary | ICD-10-CM | POA: Diagnosis not present

## 2023-05-11 DIAGNOSIS — R2681 Unsteadiness on feet: Secondary | ICD-10-CM | POA: Diagnosis not present

## 2023-05-11 DIAGNOSIS — W19XXXD Unspecified fall, subsequent encounter: Secondary | ICD-10-CM | POA: Diagnosis not present

## 2023-05-13 DIAGNOSIS — W19XXXD Unspecified fall, subsequent encounter: Secondary | ICD-10-CM | POA: Diagnosis not present

## 2023-05-13 DIAGNOSIS — R2681 Unsteadiness on feet: Secondary | ICD-10-CM | POA: Diagnosis not present

## 2023-05-13 DIAGNOSIS — M6281 Muscle weakness (generalized): Secondary | ICD-10-CM | POA: Diagnosis not present

## 2023-05-18 DIAGNOSIS — M6281 Muscle weakness (generalized): Secondary | ICD-10-CM | POA: Diagnosis not present

## 2023-05-18 DIAGNOSIS — R2681 Unsteadiness on feet: Secondary | ICD-10-CM | POA: Diagnosis not present

## 2023-05-18 DIAGNOSIS — W19XXXD Unspecified fall, subsequent encounter: Secondary | ICD-10-CM | POA: Diagnosis not present

## 2023-05-21 DIAGNOSIS — M6281 Muscle weakness (generalized): Secondary | ICD-10-CM | POA: Diagnosis not present

## 2023-05-21 DIAGNOSIS — R2681 Unsteadiness on feet: Secondary | ICD-10-CM | POA: Diagnosis not present

## 2023-05-28 DIAGNOSIS — R410 Disorientation, unspecified: Secondary | ICD-10-CM | POA: Diagnosis not present

## 2023-05-28 DIAGNOSIS — R399 Unspecified symptoms and signs involving the genitourinary system: Secondary | ICD-10-CM | POA: Diagnosis not present

## 2023-05-28 DIAGNOSIS — R5383 Other fatigue: Secondary | ICD-10-CM | POA: Diagnosis not present

## 2023-05-28 DIAGNOSIS — R8290 Unspecified abnormal findings in urine: Secondary | ICD-10-CM | POA: Diagnosis not present

## 2023-05-28 DIAGNOSIS — R3989 Other symptoms and signs involving the genitourinary system: Secondary | ICD-10-CM | POA: Diagnosis not present

## 2023-05-28 DIAGNOSIS — R81 Glycosuria: Secondary | ICD-10-CM | POA: Diagnosis not present

## 2023-06-15 DIAGNOSIS — W19XXXD Unspecified fall, subsequent encounter: Secondary | ICD-10-CM | POA: Diagnosis not present

## 2023-06-15 DIAGNOSIS — M6281 Muscle weakness (generalized): Secondary | ICD-10-CM | POA: Diagnosis not present

## 2023-06-15 DIAGNOSIS — R2681 Unsteadiness on feet: Secondary | ICD-10-CM | POA: Diagnosis not present

## 2023-06-22 DIAGNOSIS — M6281 Muscle weakness (generalized): Secondary | ICD-10-CM | POA: Diagnosis not present

## 2023-06-22 DIAGNOSIS — R2681 Unsteadiness on feet: Secondary | ICD-10-CM | POA: Diagnosis not present

## 2023-06-24 DIAGNOSIS — M6281 Muscle weakness (generalized): Secondary | ICD-10-CM | POA: Diagnosis not present

## 2023-06-24 DIAGNOSIS — R2681 Unsteadiness on feet: Secondary | ICD-10-CM | POA: Diagnosis not present

## 2023-11-02 DIAGNOSIS — H02834 Dermatochalasis of left upper eyelid: Secondary | ICD-10-CM | POA: Diagnosis not present

## 2023-11-02 DIAGNOSIS — H43393 Other vitreous opacities, bilateral: Secondary | ICD-10-CM | POA: Diagnosis not present

## 2023-11-02 DIAGNOSIS — H18513 Endothelial corneal dystrophy, bilateral: Secondary | ICD-10-CM | POA: Diagnosis not present

## 2023-11-02 DIAGNOSIS — H02831 Dermatochalasis of right upper eyelid: Secondary | ICD-10-CM | POA: Diagnosis not present

## 2023-11-02 DIAGNOSIS — H353132 Nonexudative age-related macular degeneration, bilateral, intermediate dry stage: Secondary | ICD-10-CM | POA: Diagnosis not present

## 2023-11-02 DIAGNOSIS — H524 Presbyopia: Secondary | ICD-10-CM | POA: Diagnosis not present

## 2023-11-02 DIAGNOSIS — H0288A Meibomian gland dysfunction right eye, upper and lower eyelids: Secondary | ICD-10-CM | POA: Diagnosis not present

## 2023-11-02 DIAGNOSIS — H35033 Hypertensive retinopathy, bilateral: Secondary | ICD-10-CM | POA: Diagnosis not present

## 2023-11-02 DIAGNOSIS — H35363 Drusen (degenerative) of macula, bilateral: Secondary | ICD-10-CM | POA: Diagnosis not present

## 2023-11-02 DIAGNOSIS — H52203 Unspecified astigmatism, bilateral: Secondary | ICD-10-CM | POA: Diagnosis not present

## 2023-11-02 DIAGNOSIS — H0288B Meibomian gland dysfunction left eye, upper and lower eyelids: Secondary | ICD-10-CM | POA: Diagnosis not present

## 2023-11-02 DIAGNOSIS — H04123 Dry eye syndrome of bilateral lacrimal glands: Secondary | ICD-10-CM | POA: Diagnosis not present

## 2023-11-16 DIAGNOSIS — L821 Other seborrheic keratosis: Secondary | ICD-10-CM | POA: Diagnosis not present

## 2023-11-16 DIAGNOSIS — D1801 Hemangioma of skin and subcutaneous tissue: Secondary | ICD-10-CM | POA: Diagnosis not present

## 2023-11-16 DIAGNOSIS — L820 Inflamed seborrheic keratosis: Secondary | ICD-10-CM | POA: Diagnosis not present

## 2023-11-16 DIAGNOSIS — D692 Other nonthrombocytopenic purpura: Secondary | ICD-10-CM | POA: Diagnosis not present

## 2024-03-06 DEATH — deceased
